# Patient Record
Sex: Male | Born: 1975 | Race: White | Hispanic: No | Marital: Married | Smoking: Never smoker
Health system: Southern US, Community
[De-identification: ages and names within clinical notes are randomized; demographics above are authoritative.]

## PROBLEM LIST (undated history)

## (undated) DIAGNOSIS — J45909 Unspecified asthma, uncomplicated: Secondary | ICD-10-CM

## (undated) HISTORY — PX: HERNIA REPAIR: SHX51

## (undated) HISTORY — DX: Unspecified asthma, uncomplicated: J45.909

---

## 2019-10-20 ENCOUNTER — Ambulatory Visit: Payer: Self-pay | Attending: Internal Medicine

## 2019-10-20 DIAGNOSIS — Z23 Encounter for immunization: Secondary | ICD-10-CM

## 2019-10-20 NOTE — Progress Notes (Signed)
   Covid-19 Vaccination Clinic  Name:  John Adams    MRN: [id number] DOB: 1975/08/04  10/20/2019  Mr. John Adams was observed post Covid-19 immunization for 15 minutes without incident. He was provided with Vaccine Information Sheet and instruction to access the V-Safe system.   Mr. John Adams was instructed to call 911 with any severe reactions post vaccine: John Adams Kitchen Difficulty breathing  . Swelling of face and throat  . A fast heartbeat  . A bad rash all over body  . Dizziness and weakness   Immunizations Administered    Name Date Dose VIS Date Route   Moderna COVID-19 Vaccine 10/20/2019  9:17 AM 0.5 mL 06/14/2019 Intramuscular   Manufacturer: Moderna   Lot: [id number]   NDC: 16553-748-27

## 2020-07-19 ENCOUNTER — Ambulatory Visit (AMBULATORY_SURGERY_CENTER): Payer: Self-pay

## 2020-07-19 ENCOUNTER — Emergency Department (HOSPITAL_BASED_OUTPATIENT_CLINIC_OR_DEPARTMENT_OTHER)
Admission: EM | Admit: 2020-07-19 | Discharge: 2020-07-19 | Disposition: A | Discharge status: Home / Self Care | Payer: BC Managed Care – PPO | Source: Home / Self Care | Attending: Emergency Medicine | Admitting: Emergency Medicine

## 2020-07-19 ENCOUNTER — Other Ambulatory Visit: Payer: Self-pay

## 2020-07-19 ENCOUNTER — Emergency Department (HOSPITAL_BASED_OUTPATIENT_CLINIC_OR_DEPARTMENT_OTHER): Payer: BC Managed Care – PPO

## 2020-07-19 ENCOUNTER — Encounter (HOSPITAL_BASED_OUTPATIENT_CLINIC_OR_DEPARTMENT_OTHER): Payer: Self-pay | Admitting: *Deleted

## 2020-07-19 VITALS — Ht 63.0 in | Wt 209.0 lb

## 2020-07-19 DIAGNOSIS — Z1211 Encounter for screening for malignant neoplasm of colon: Secondary | ICD-10-CM

## 2020-07-19 DIAGNOSIS — N10 Acute tubulo-interstitial nephritis: Secondary | ICD-10-CM | POA: Insufficient documentation

## 2020-07-19 DIAGNOSIS — Z6836 Body mass index (BMI) 36.0-36.9, adult: Secondary | ICD-10-CM | POA: Diagnosis not present

## 2020-07-19 DIAGNOSIS — T39395A Adverse effect of other nonsteroidal anti-inflammatory drugs [NSAID], initial encounter: Secondary | ICD-10-CM | POA: Diagnosis present

## 2020-07-19 DIAGNOSIS — N179 Acute kidney failure, unspecified: Secondary | ICD-10-CM | POA: Diagnosis not present

## 2020-07-19 DIAGNOSIS — Z7951 Long term (current) use of inhaled steroids: Secondary | ICD-10-CM | POA: Insufficient documentation

## 2020-07-19 DIAGNOSIS — N12 Tubulo-interstitial nephritis, not specified as acute or chronic: Secondary | ICD-10-CM

## 2020-07-19 DIAGNOSIS — R630 Anorexia: Secondary | ICD-10-CM | POA: Diagnosis present

## 2020-07-19 DIAGNOSIS — R109 Unspecified abdominal pain: Secondary | ICD-10-CM | POA: Insufficient documentation

## 2020-07-19 DIAGNOSIS — K760 Fatty (change of) liver, not elsewhere classified: Secondary | ICD-10-CM | POA: Diagnosis present

## 2020-07-19 DIAGNOSIS — K30 Functional dyspepsia: Secondary | ICD-10-CM | POA: Diagnosis present

## 2020-07-19 DIAGNOSIS — K5900 Constipation, unspecified: Secondary | ICD-10-CM | POA: Diagnosis present

## 2020-07-19 DIAGNOSIS — J45909 Unspecified asthma, uncomplicated: Secondary | ICD-10-CM | POA: Diagnosis present

## 2020-07-19 DIAGNOSIS — Z20822 Contact with and (suspected) exposure to covid-19: Secondary | ICD-10-CM | POA: Diagnosis present

## 2020-07-19 DIAGNOSIS — K219 Gastro-esophageal reflux disease without esophagitis: Secondary | ICD-10-CM | POA: Diagnosis present

## 2020-07-19 DIAGNOSIS — Z79899 Other long term (current) drug therapy: Secondary | ICD-10-CM | POA: Diagnosis not present

## 2020-07-19 DIAGNOSIS — E669 Obesity, unspecified: Secondary | ICD-10-CM | POA: Diagnosis present

## 2020-07-19 LAB — URINALYSIS, ROUTINE W REFLEX MICROSCOPIC
Bilirubin Urine: NEGATIVE
Glucose, UA: NEGATIVE mg/dL
Ketones, ur: NEGATIVE mg/dL
Nitrite: NEGATIVE
Protein, ur: >300 mg/dL — AB
Specific Gravity, Urine: 1.025 (ref 1.005–1.030)
pH: 7.5 (ref 5.0–8.0)

## 2020-07-19 LAB — COMPREHENSIVE METABOLIC PANEL
ALT: 43 U/L (ref 0–44)
AST: 24 U/L (ref 15–41)
Albumin: 4.1 g/dL (ref 3.5–5.0)
Alkaline Phosphatase: 81 U/L (ref 38–126)
Anion gap: 11 (ref 5–15)
BUN: 28 mg/dL — ABNORMAL HIGH (ref 6–20)
CO2: 26 mmol/L (ref 22–32)
Calcium: 10.1 mg/dL (ref 8.9–10.3)
Chloride: 101 mmol/L (ref 98–111)
Creatinine, Ser: 2.09 mg/dL — ABNORMAL HIGH (ref 0.61–1.24)
GFR, Estimated: 39 mL/min — ABNORMAL LOW (ref >60)
Glucose, Bld: 117 mg/dL — ABNORMAL HIGH (ref 70–99)
Potassium: 4.5 mmol/L (ref 3.5–5.1)
Sodium: 138 mmol/L (ref 135–145)
Total Bilirubin: 0.5 mg/dL (ref 0.3–1.2)
Total Protein: 7.9 g/dL (ref 6.5–8.1)

## 2020-07-19 LAB — URINALYSIS, MICROSCOPIC (REFLEX)

## 2020-07-19 LAB — CBC WITH DIFFERENTIAL/PLATELET
Abs Immature Granulocytes: 0.05 10*3/uL (ref 0.00–0.07)
Basophils Absolute: 0.1 10*3/uL (ref 0.0–0.1)
Basophils Relative: 1 %
Eosinophils Absolute: 0.5 10*3/uL (ref 0.0–0.5)
Eosinophils Relative: 3 %
HCT: 44.4 % (ref 39.0–52.0)
Hemoglobin: 14.7 g/dL (ref 13.0–17.0)
Immature Granulocytes: 0 %
Lymphocytes Relative: 14 %
Lymphs Abs: 2.3 10*3/uL (ref 0.7–4.0)
MCH: 29.8 pg (ref 26.0–34.0)
MCHC: 33.1 g/dL (ref 30.0–36.0)
MCV: 89.9 fL (ref 80.0–100.0)
Monocytes Absolute: 1.7 10*3/uL — ABNORMAL HIGH (ref 0.1–1.0)
Monocytes Relative: 10 %
Neutro Abs: 11.8 10*3/uL — ABNORMAL HIGH (ref 1.7–7.7)
Neutrophils Relative %: 72 %
Platelets: 356 10*3/uL (ref 150–400)
RBC: 4.94 MIL/uL (ref 4.22–5.81)
RDW: 12.3 % (ref 11.5–15.5)
WBC: 16.4 10*3/uL — ABNORMAL HIGH (ref 4.0–10.5)
nRBC: 0 % (ref 0.0–0.2)

## 2020-07-19 MED ORDER — NA SULFATE-K SULFATE-MG SULF 17.5-3.13-1.6 GM/177ML PO SOLN: 1.0000 | Freq: Once | ORAL | 0 refills | Status: AC

## 2020-07-19 MED ORDER — IBUPROFEN 800 MG PO TABS: 800.0000 mg | ORAL_TABLET | Freq: Three times a day (TID) | ORAL | 0 refills | Status: DC

## 2020-07-19 MED ORDER — MORPHINE SULFATE (PF) 4 MG/ML IV SOLN
4.0000 mg | Freq: Once | INTRAVENOUS | Status: AC
  Administered 2020-07-19: 4 mg via INTRAVENOUS
  Filled 2020-07-19: qty 1

## 2020-07-19 MED ORDER — KETOROLAC TROMETHAMINE 30 MG/ML IJ SOLN
30.0000 mg | Freq: Once | INTRAMUSCULAR | Status: AC
  Administered 2020-07-19: 30 mg via INTRAVENOUS
  Filled 2020-07-19: qty 1

## 2020-07-19 MED ORDER — CEPHALEXIN 500 MG PO CAPS: 500.0000 mg | ORAL_CAPSULE | Freq: Two times a day (BID) | ORAL | 0 refills | Status: DC

## 2020-07-19 MED ORDER — SODIUM CHLORIDE 0.9 % IV SOLN
1.0000 g | Freq: Once | INTRAVENOUS | Status: AC
  Administered 2020-07-19: 1 g via INTRAVENOUS
  Filled 2020-07-19: qty 10

## 2020-07-19 MED ORDER — SODIUM CHLORIDE 0.9 % IV BOLUS
2000.0000 mL | Freq: Once | INTRAVENOUS | Status: AC
  Administered 2020-07-19: 2000 mL via INTRAVENOUS

## 2020-07-19 NOTE — ED Notes (Signed)
Patient verbalizes understanding of discharge instructions. Opportunity for questioning and answers were provided. Armband removed by staff, pt discharged from ED ambulatory to home.  

## 2020-07-19 NOTE — ED Notes (Signed)
ED Provider at bedside. 

## 2020-07-19 NOTE — ED Provider Notes (Signed)
[facility] Provider Note   CSN: [id number] Arrival date & time: 07/19/20  1637     History Chief Complaint  Patient presents with  . Back Pain  . Chills    John Adams is a 45 y.o. male.  HPI 45 year old male with no significant medical history presents to the ER with bilateral back aches and chills for the last 3 days.  States that he has been taking Motrin with some relief, however pain returns.  He states the pain has gotten significantly worse today.  He states the pain will radiate into the front of his abdomen and it feels like "someone is sucking the air out of his abdomen".  Denies any dysuria but has felt like his urine flow has decreased.  This is never happened before.  He has vaccinated and boosted for COVID-19.  No history of IV drug use.    Past Medical History:  Diagnosis Date  . Asthma     There are no problems to display for this patient.   Past Surgical History:  Procedure Laterality Date  . HERNIA REPAIR         Family History  Problem Relation Age of Onset  . Colon cancer Neg Hx   . Colon polyps Neg Hx   . Esophageal cancer Neg Hx   . Rectal cancer Neg Hx   . Stomach cancer Neg Hx     Social History   Tobacco Use  . Smoking status: Never Smoker  . Smokeless tobacco: Never Used  Vaping Use  . Vaping Use: Never used  Substance Use Topics  . Alcohol use: Yes    Comment: occ  . Drug use: Never    Home Medications Prior to Admission medications   Medication Sig Start Date End Date Taking? Authorizing Provider  cephALEXin (KEFLEX) 500 MG capsule Take 1 capsule (500 mg total) by mouth 2 (two) times daily for 7 days. 07/19/20 07/26/20 Yes Garald Balding, PA-C  ibuprofen (ADVIL) 800 MG tablet Take 1 tablet (800 mg total) by mouth 3 (three) times daily. 07/19/20  Yes Garald Balding, PA-C  albuterol (PROAIR HFA) 108 (90 Base) MCG/ACT inhaler Inhale 2 puffs into the lungs every 6 (six) hours as needed. 03/04/19    [provider]  amitriptyline (ELAVIL) 25 MG tablet Take by mouth. 03/04/19   [provider]  fluticasone (FLONASE) 50 MCG/ACT nasal spray Place into the nose. 03/04/19   [provider]  fluticasone furoate-vilanterol (BREO ELLIPTA) 200-25 MCG/INH AEPB Inhale into the lungs. 07/06/19   [provider]  Fluticasone-Salmeterol (ADVAIR) 100-50 MCG/DOSE AEPB Inhale into the lungs. 03/04/19   [provider]  Na Sulfate-K Sulfate-Mg Sulf 17.5-3.13-1.6 GM/177ML SOLN Take 1 kit by mouth once for 1 dose. 07/19/20 07/19/20  Thornton Park, MD    Allergies    Patient has no known allergies.  Review of Systems   Review of Systems  Constitutional: Negative for chills and fever.  HENT: Negative for ear pain and sore throat.   Eyes: Negative for pain and visual disturbance.  Respiratory: Negative for cough and shortness of breath.   Cardiovascular: Negative for chest pain and palpitations.  Gastrointestinal: Positive for abdominal pain. Negative for vomiting.  Genitourinary: Negative for dysuria and hematuria.  Musculoskeletal: Positive for back pain. Negative for arthralgias.  Skin: Negative for color change and rash.  Neurological: Negative for seizures and syncope.  All other systems reviewed and are negative.   Physical Exam Updated Vital  Signs BP 122/85 (BP Location: Right Arm)   Pulse 85   Temp 98.6 F (37 C) (Oral)   Resp 20   Ht _0  (1.6 m)   Wt 95.1 kg   SpO2 98%   BMI 37.13 kg/m   Physical Exam Constitutional:      General: He is not in acute distress.    Appearance: Normal appearance. He is not ill-appearing or diaphoretic.  HENT:     Head: Normocephalic and atraumatic.  Eyes:     Extraocular Movements: Extraocular movements intact.     Conjunctiva/sclera: Conjunctivae normal.     Pupils: Pupils are equal, round, and reactive to light.  Cardiovascular:     Rate and Rhythm: Normal rate and regular rhythm.     Pulses: Normal  pulses.     Heart sounds: Normal heart sounds.  Pulmonary:     Effort: Pulmonary effort is normal.     Breath sounds: Normal breath sounds.  Abdominal:     General: Abdomen is flat. Bowel sounds are normal. There is no distension.     Palpations: Abdomen is soft. There is no mass.     Tenderness: There is no abdominal tenderness. There is right CVA tenderness and left CVA tenderness. There is no guarding or rebound.     Comments: No significant abdominal tenderness on exam.  Lateral flank tenderness on exam.  Musculoskeletal:        General: Normal range of motion.     Cervical back: Normal range of motion. No tenderness.  Skin:    General: Skin is warm.  Neurological:     General: No focal deficit present.     Mental Status: He is alert and oriented to person, place, and time.     Sensory: No sensory deficit.     Motor: No weakness.  Psychiatric:        Mood and Affect: Mood normal.        Behavior: Behavior normal.     ED Results / Procedures / Treatments   Labs (all labs ordered are listed, but only abnormal results are displayed) Labs Reviewed  URINALYSIS, ROUTINE W REFLEX MICROSCOPIC - Abnormal; Notable for the following components:      Result Value   Hgb urine dipstick LARGE (*)    Protein, ur >300 (*)    Leukocytes,Ua SMALL (*)    All other components within normal limits  CBC WITH DIFFERENTIAL/PLATELET - Abnormal; Notable for the following components:   WBC 16.4 (*)    Neutro Abs 11.8 (*)    Monocytes Absolute 1.7 (*)    All other components within normal limits  COMPREHENSIVE METABOLIC PANEL - Abnormal; Notable for the following components:   Glucose, Bld 117 (*)    BUN 28 (*)    Creatinine, Ser 2.09 (*)    GFR, Estimated 39 (*)    All other components within normal limits  URINALYSIS, MICROSCOPIC (REFLEX) - Abnormal; Notable for the following components:   Bacteria, UA FEW (*)    All other components within normal limits  SARS CORONAVIRUS 2 (TAT 6-24 HRS)   URINE CULTURE    EKG None  Radiology CT Renal Stone Study  Result Date: 07/19/2020 CLINICAL DATA:  45 year old male with history of bilateral flank pain. Microscopic hematuria. EXAM: CT ABDOMEN AND PELVIS WITHOUT CONTRAST TECHNIQUE: Multidetector CT imaging of the abdomen and pelvis was performed following the standard protocol without IV contrast. COMPARISON:  No priors. FINDINGS: Lower chest: Unremarkable. Hepatobiliary: Diffuse low attenuation  throughout the hepatic parenchyma of severe hepatic steatosis, indicative. No definite suspicious cystic or solid hepatic lesions are confidently identified on today's noncontrast CT examination. Unenhanced appearance of the gallbladder is normal. Pancreas: No definite pancreatic mass or peripancreatic fluid collections or inflammatory changes are noted on today's noncontrast CT examination. Spleen: Unremarkable. Adrenals/Urinary Tract: There are no abnormal calcifications within the collecting system of either kidney, along the course of either ureter, or within the lumen of the urinary bladder. No hydroureteronephrosis or perinephric stranding to suggest urinary tract obstruction at this time. The unenhanced appearance of the kidneys is unremarkable bilaterally. Unenhanced appearance of the urinary bladder is normal. Bilateral adrenal glands are normal in appearance. Stomach/Bowel: Unenhanced appearance of the stomach is normal. There is no pathologic dilatation of small bowel or colon. Normal appendix. Vascular/Lymphatic: Aortic atherosclerosis. No lymphadenopathy noted in the abdomen or pelvis. Reproductive: Prostate gland and seminal vesicles are unremarkable in appearance. Other: Status post bilateral inguinal herniorrhaphy. No significant volume of ascites. No pneumoperitoneum. Musculoskeletal: There are no aggressive appearing lytic or blastic lesions noted in the visualized portions of the skeleton. IMPRESSION: 1. No acute findings are noted in the  abdomen or pelvis to account for the patient's symptoms. Specifically, no urinary tract calculi no findings of urinary tract obstruction are noted at this time. 2. Hepatic steatosis. 3. Aortic atherosclerosis. Electronically Signed   By: Vinnie Langton M.D.   On: 07/19/2020 19:23    Procedures Procedures (including critical care time)  Medications Ordered in ED Medications  sodium chloride 0.9 % bolus 2,000 mL (0 mLs Intravenous Stopped 07/19/20 2104)  ketorolac (TORADOL) 30 MG/ML injection 30 mg (30 mg Intravenous Given 07/19/20 1920)  cefTRIAXone (ROCEPHIN) 1 g in sodium chloride 0.9 % 100 mL IVPB (0 g Intravenous Stopped 07/19/20 2104)  morphine 4 MG/ML injection 4 mg (4 mg Intravenous Given 07/19/20 2103)    ED Course  I have reviewed the triage vital signs and the nursing notes.  Pertinent labs & imaging results that were available during my care of the patient were reviewed by me and considered in my medical decision making (see chart for details).    MDM Rules/Calculators/A&P                          45 year old male with complaints of bilateral back pain and chills.  On arrival, patient is afebrile, of vitals on arrival overall reassuring.  Afebrile.  Physical exam with bilateral flank tenderness, no significant abdominal tenderness on exam.  CBC with a leukocytosis of 16.4, no other significant abnormalities.  CMP with a creatinine of 2.09.  UA with large amount of hemoglobin, more than 300 proteinuria and small leukocytes with few bacteria.  CT renal study without evidence of UTI.  Question myalgias versus pyelonephritis.  Low suspicion for dissection, cauda equina, spinal abscess.  Patient was treated here with Rocephin, will send home with course of Keflex.  Encourage PCP follow-up for recheck of creatinine.  Pain treated with Toradol and morphine.  Will send home with 800 mg of ibuprofen for pain.  Return precautions discussed.  He voiced understanding and is agreeable.  At this stage  in the ED course, the patient is medically screened and stable for discharge. Final Clinical Impression(s) / ED Diagnoses Final diagnoses:  Pyelonephritis    Rx / DC Orders ED Discharge Orders         Ordered    cephALEXin (KEFLEX) 500 MG capsule  2 times  daily        07/19/20 2134    ibuprofen (ADVIL) 800 MG tablet  3 times daily        07/19/20 2134           Lyndel Safe 07/19/20 2135    Charlesetta Shanks, MD 07/20/20 2120

## 2020-07-19 NOTE — ED Triage Notes (Signed)
Back pain and chills x 3 days. Pain is relieved with Motrin. Scanty urine output.

## 2020-07-19 NOTE — ED Notes (Signed)
Patient transported to CT 

## 2020-07-19 NOTE — Progress Notes (Signed)
No allergies to soy or egg Pt is not on blood thinners or diet pills Denies issues with sedation/intubation Denies atrial flutter/fib Denies constipation   Emmi instructions given to pt  Pt is aware of Covid safety and care partner requirements.  

## 2020-07-19 NOTE — Discharge Instructions (Addendum)
Your work-up today did not show any evidence of kidney stones, your urine culture still pending but we will treat you for a possible UTI that are spread to your kidneys.  Please take the antibiotic as directed until finished.  Use ibuprofen as needed for pain.  Your kidney function test was a little elevated today, please follow-up with your primary care doctor to have this rechecked.  Return to the ER for any new or worsening symptoms.

## 2020-07-20 LAB — SARS CORONAVIRUS 2 (TAT 6-24 HRS): SARS Coronavirus 2: NEGATIVE

## 2020-07-21 LAB — URINE CULTURE: Culture: NO GROWTH

## 2020-07-22 ENCOUNTER — Emergency Department (HOSPITAL_BASED_OUTPATIENT_CLINIC_OR_DEPARTMENT_OTHER): Payer: BC Managed Care – PPO

## 2020-07-22 ENCOUNTER — Other Ambulatory Visit: Payer: Self-pay

## 2020-07-22 ENCOUNTER — Inpatient Hospital Stay
Admission: AD | Admit: 2020-07-22 | Payer: BC Managed Care – PPO | Source: Other Acute Inpatient Hospital | Admitting: Internal Medicine

## 2020-07-22 ENCOUNTER — Inpatient Hospital Stay (HOSPITAL_BASED_OUTPATIENT_CLINIC_OR_DEPARTMENT_OTHER)
Admission: EM | Admit: 2020-07-22 | Discharge: 2020-07-24 | DRG: 684 | Disposition: A | Discharge status: Home / Self Care | Payer: BC Managed Care – PPO | Attending: Internal Medicine | Admitting: Internal Medicine

## 2020-07-22 ENCOUNTER — Encounter (HOSPITAL_BASED_OUTPATIENT_CLINIC_OR_DEPARTMENT_OTHER): Payer: Self-pay

## 2020-07-22 DIAGNOSIS — Z20822 Contact with and (suspected) exposure to covid-19: Secondary | ICD-10-CM | POA: Diagnosis present

## 2020-07-22 DIAGNOSIS — R109 Unspecified abdominal pain: Secondary | ICD-10-CM

## 2020-07-22 DIAGNOSIS — K760 Fatty (change of) liver, not elsewhere classified: Secondary | ICD-10-CM | POA: Diagnosis present

## 2020-07-22 DIAGNOSIS — N179 Acute kidney failure, unspecified: Secondary | ICD-10-CM | POA: Diagnosis present

## 2020-07-22 DIAGNOSIS — J45909 Unspecified asthma, uncomplicated: Secondary | ICD-10-CM | POA: Diagnosis present

## 2020-07-22 DIAGNOSIS — K219 Gastro-esophageal reflux disease without esophagitis: Secondary | ICD-10-CM | POA: Diagnosis present

## 2020-07-22 DIAGNOSIS — K30 Functional dyspepsia: Secondary | ICD-10-CM | POA: Diagnosis present

## 2020-07-22 DIAGNOSIS — N10 Acute tubulo-interstitial nephritis: Secondary | ICD-10-CM | POA: Diagnosis present

## 2020-07-22 DIAGNOSIS — Z6836 Body mass index (BMI) 36.0-36.9, adult: Secondary | ICD-10-CM | POA: Diagnosis not present

## 2020-07-22 DIAGNOSIS — E669 Obesity, unspecified: Secondary | ICD-10-CM | POA: Diagnosis present

## 2020-07-22 DIAGNOSIS — K5900 Constipation, unspecified: Secondary | ICD-10-CM | POA: Diagnosis present

## 2020-07-22 DIAGNOSIS — R1010 Upper abdominal pain, unspecified: Secondary | ICD-10-CM

## 2020-07-22 DIAGNOSIS — R630 Anorexia: Secondary | ICD-10-CM | POA: Diagnosis present

## 2020-07-22 DIAGNOSIS — T39395A Adverse effect of other nonsteroidal anti-inflammatory drugs [NSAID], initial encounter: Secondary | ICD-10-CM | POA: Diagnosis present

## 2020-07-22 DIAGNOSIS — Z79899 Other long term (current) drug therapy: Secondary | ICD-10-CM

## 2020-07-22 LAB — COMPREHENSIVE METABOLIC PANEL
ALT: 24 U/L (ref 0–44)
AST: 18 U/L (ref 15–41)
Albumin: 3.4 g/dL — ABNORMAL LOW (ref 3.5–5.0)
Alkaline Phosphatase: 72 U/L (ref 38–126)
Anion gap: 11 (ref 5–15)
BUN: 46 mg/dL — ABNORMAL HIGH (ref 6–20)
CO2: 22 mmol/L (ref 22–32)
Calcium: 8.4 mg/dL — ABNORMAL LOW (ref 8.9–10.3)
Chloride: 104 mmol/L (ref 98–111)
Creatinine, Ser: 4.44 mg/dL — ABNORMAL HIGH (ref 0.61–1.24)
GFR, Estimated: 16 mL/min — ABNORMAL LOW (ref >60)
Glucose, Bld: 112 mg/dL — ABNORMAL HIGH (ref 70–99)
Potassium: 4 mmol/L (ref 3.5–5.1)
Sodium: 137 mmol/L (ref 135–145)
Total Bilirubin: 0.5 mg/dL (ref 0.3–1.2)
Total Protein: 7.2 g/dL (ref 6.5–8.1)

## 2020-07-22 LAB — CBC WITH DIFFERENTIAL/PLATELET
Abs Immature Granulocytes: 0.05 10*3/uL (ref 0.00–0.07)
Basophils Absolute: 0.1 10*3/uL (ref 0.0–0.1)
Basophils Relative: 1 %
Eosinophils Absolute: 0.4 10*3/uL (ref 0.0–0.5)
Eosinophils Relative: 3 %
HCT: 41.8 % (ref 39.0–52.0)
Hemoglobin: 14.2 g/dL (ref 13.0–17.0)
Immature Granulocytes: 0 %
Lymphocytes Relative: 12 %
Lymphs Abs: 1.7 10*3/uL (ref 0.7–4.0)
MCH: 30.1 pg (ref 26.0–34.0)
MCHC: 34 g/dL (ref 30.0–36.0)
MCV: 88.6 fL (ref 80.0–100.0)
Monocytes Absolute: 2.1 10*3/uL — ABNORMAL HIGH (ref 0.1–1.0)
Monocytes Relative: 16 %
Neutro Abs: 9.3 10*3/uL — ABNORMAL HIGH (ref 1.7–7.7)
Neutrophils Relative %: 68 %
Platelets: 334 10*3/uL (ref 150–400)
RBC: 4.72 MIL/uL (ref 4.22–5.81)
RDW: 12.4 % (ref 11.5–15.5)
WBC: 13.7 10*3/uL — ABNORMAL HIGH (ref 4.0–10.5)
nRBC: 0 % (ref 0.0–0.2)

## 2020-07-22 LAB — PROTIME-INR
INR: 1 (ref 0.8–1.2)
Prothrombin Time: 13.2 seconds (ref 11.4–15.2)

## 2020-07-22 LAB — BASIC METABOLIC PANEL
Anion gap: 14 (ref 5–15)
BUN: 41 mg/dL — ABNORMAL HIGH (ref 6–20)
CO2: 18 mmol/L — ABNORMAL LOW (ref 22–32)
Calcium: 8.4 mg/dL — ABNORMAL LOW (ref 8.9–10.3)
Chloride: 103 mmol/L (ref 98–111)
Creatinine, Ser: 4.04 mg/dL — ABNORMAL HIGH (ref 0.61–1.24)
GFR, Estimated: 18 mL/min — ABNORMAL LOW (ref >60)
Glucose, Bld: 95 mg/dL (ref 70–99)
Potassium: 3.8 mmol/L (ref 3.5–5.1)
Sodium: 135 mmol/L (ref 135–145)

## 2020-07-22 LAB — URINALYSIS, ROUTINE W REFLEX MICROSCOPIC
Bilirubin Urine: NEGATIVE
Glucose, UA: NEGATIVE mg/dL
Ketones, ur: NEGATIVE mg/dL
Leukocytes,Ua: NEGATIVE
Nitrite: NEGATIVE
Protein, ur: NEGATIVE mg/dL
Specific Gravity, Urine: 1.01 (ref 1.005–1.030)
pH: 6 (ref 5.0–8.0)

## 2020-07-22 LAB — TSH: TSH: 4.021 u[IU]/mL (ref 0.350–4.500)

## 2020-07-22 LAB — RESP PANEL BY RT-PCR (FLU A&B, COVID) ARPGX2
Influenza A by PCR: NEGATIVE
Influenza B by PCR: NEGATIVE
SARS Coronavirus 2 by RT PCR: NEGATIVE

## 2020-07-22 LAB — URINALYSIS, MICROSCOPIC (REFLEX)

## 2020-07-22 LAB — CK: Total CK: 88 U/L (ref 49–397)

## 2020-07-22 LAB — NA AND K (SODIUM & POTASSIUM), RAND UR
Potassium Urine: 12 mmol/L
Sodium, Ur: 54 mmol/L

## 2020-07-22 LAB — MRSA PCR SCREENING

## 2020-07-22 LAB — C-REACTIVE PROTEIN: CRP: 8 mg/dL — ABNORMAL HIGH (ref <1.0)

## 2020-07-22 LAB — LIPASE, BLOOD: Lipase: 24 U/L (ref 11–51)

## 2020-07-22 LAB — CREATININE, URINE, RANDOM: Creatinine, Urine: 58.31 mg/dL

## 2020-07-22 LAB — HIV ANTIBODY (ROUTINE TESTING W REFLEX): HIV Screen 4th Generation wRfx: NONREACTIVE

## 2020-07-22 MED ORDER — HEPARIN SODIUM (PORCINE) 5000 UNIT/ML IJ SOLN
5000.0000 [IU] | Freq: Three times a day (TID) | INTRAMUSCULAR | Status: DC
  Administered 2020-07-22 – 2020-07-24 (×5): 5000 [IU] via SUBCUTANEOUS
  Filled 2020-07-22 (×5): qty 1

## 2020-07-22 MED ORDER — FENTANYL CITRATE (PF) 100 MCG/2ML IJ SOLN
100.0000 ug | Freq: Once | INTRAMUSCULAR | Status: DC
Start: 2020-07-22 — End: 2020-07-24
  Filled 2020-07-22: qty 2

## 2020-07-22 MED ORDER — ALBUTEROL SULFATE HFA 108 (90 BASE) MCG/ACT IN AERS
2.0000 | INHALATION_SPRAY | Freq: Four times a day (QID) | RESPIRATORY_TRACT | Status: DC | PRN
  Filled 2020-07-22: qty 6.7

## 2020-07-22 MED ORDER — ACETAMINOPHEN 325 MG PO TABS
650.0000 mg | ORAL_TABLET | Freq: Four times a day (QID) | ORAL | Status: DC | PRN
  Administered 2020-07-24: 650 mg via ORAL
  Filled 2020-07-22: qty 2

## 2020-07-22 MED ORDER — SODIUM CHLORIDE 0.9 % IV SOLN
INTRAVENOUS | Status: DC | PRN
  Administered 2020-07-22: 250 mL via INTRAVENOUS

## 2020-07-22 MED ORDER — SODIUM CHLORIDE 0.9 % IV SOLN
1.0000 g | Freq: Once | INTRAVENOUS | Status: AC
  Administered 2020-07-22: 1 g via INTRAVENOUS
  Filled 2020-07-22: qty 10

## 2020-07-22 MED ORDER — SODIUM CHLORIDE 0.9 % IV BOLUS
1000.0000 mL | Freq: Once | INTRAVENOUS | Status: AC
  Administered 2020-07-22: 1000 mL via INTRAVENOUS

## 2020-07-22 MED ORDER — ACETAMINOPHEN 650 MG RE SUPP: 650.0000 mg | Freq: Four times a day (QID) | RECTAL | Status: DC | PRN

## 2020-07-22 NOTE — ED Notes (Signed)
Pt refused pain medicaton, reports having small BM and pain decreased to 5/10. EDP Schlossman notified.

## 2020-07-22 NOTE — ED Notes (Signed)
Spoke with Okey Regal in lab to add on lipase

## 2020-07-22 NOTE — ED Triage Notes (Signed)
Pt was recently seen here for back pain sent home on antibiotic for UTI pt reports he is getting worse states that he no longer hurts in his back but is having pain in his abdomen now. Reports feeling nauseous with last BM on Thursday.

## 2020-07-22 NOTE — ED Notes (Signed)
Pt ambulatory with steady gait to restroom 

## 2020-07-22 NOTE — ED Notes (Signed)
Waiting for EDP to update patient regarding foley

## 2020-07-22 NOTE — H&P (Signed)
History and Physical    John Adams EPP:[id number] DOB: 1976/03/01 DOA: 07/22/2020  PCP: Maud Deed, PA   Patient coming from: home Chief Complaint: continued back pain and worsening AKI at ED  HPI: John Adams is a 45 y.o. male with a pertinent history obesity, ?Asthma and has been undergoing a chronic cough work-up in outpatient setting, especially in [place], who presented to the med [facility] ED with persistence of pain in his back.  On January 6 the patient presented to the ED with chills, backaches and was thought to have pyelonephritis and was started on Rocephin and then received a course of Keflex.  Creatinine was up to 2.09 and UA was with proteinuria, CT renal stone was done without evidence of UTI.  Keflex did not help and then the patient return to the ED with similar symptoms.  Of note, the patient states that he has been fairly regular with taking 800 mg of ibuprofen every 8 hours for quite some time with the above process. He has been constipated for 4-5 days and in the room has a bowel movement and feels this resolves.  A Foley was placed with 200 mL of fluid removed  In the med [facility] emergency department, patient was initially hypertensive, 98.9, HR 87, 98% on room air, RR 18.  WBC 13.7, Hb 14.2, PLT 334, NA 137, K4.0, CO2 22, creatinine of 4.44, calcium of 8.4, glucose 112, lactic acid and blood cultures pending.  UA shows 6-10 RBCs and was 21-50 RBCs 3 days ago.  COVID was negative.  Previous ED visit urine culture was negative CT scan of the abdomen pelvis was done showing diffuse fatty change of the liver and previous mesh hernia repair in the lower abdominal and inguinal regions and otherwise negative, kidneys are normal, bladder is normal.  They contacted nephrology in the emergency department  For the past 2 to 3 years he has had a chronic cough that has been working up especially in [place].  He states he saw an ENT provider and I asked him to  try and track these records down to see if there was any autoimmune work-up.  He complains of chronic cough, sinus congestion and does not necessarily relate them to allergies but is reasonable to say that is possible given his history of asthma as well.  I asked him to try and track down his ENT records to see if there was any lab work done. They moved here from [place] about a year ago and have been living in [place] and buying up apartment buildings and turning them into apartments, they have 2 dogs and 1 cat, no unusual hobbies, no tick bites, no bird exposures.   Review of Systems: As per HPI otherwise 10 point review of systems negative.  Other pertinents as below:  General -is any new headaches, visual changes, denies any fevers or chills HEENT -denies any recent illness or upper respiratory infection Cardio -denies any chest pain or palpitations Resp -denies any shortness of breath, seldom uses albuterol GI -as per HPI GU -denies any urinary changes that he is noted MSK -denies any joint pains or synovitis Skin -denies any new rashes or skin changes Neuro -denies any new numbness or weakness Psych -denies any new anxiety or depression   Past Medical History:  Diagnosis Date  . Asthma     Past Surgical History:  Procedure Laterality Date  . HERNIA REPAIR       reports that he has  never smoked. He has never used smokeless tobacco. He reports current alcohol use. He reports that he does not use drugs.  No Known Allergies  Family History  Problem Relation Age of Onset  . Colon cancer Neg Hx   . Colon polyps Neg Hx   . Esophageal cancer Neg Hx   . Rectal cancer Neg Hx   . Stomach cancer Neg Hx     Prior to Admission medications   Medication Sig Start Date End Date Taking? Authorizing Provider  cephALEXin (KEFLEX) 500 MG capsule Take 1 capsule (500 mg total) by mouth 2 (two) times daily for 7 days. 07/19/20 07/26/20 Yes Mare FerrariBelaya, Maria A, PA-C  ibuprofen (ADVIL) 200 MG tablet  Take 800 mg by mouth 3 (three) times daily as needed (pain).   Yes [provider]  montelukast (SINGULAIR) 10 MG tablet Take 10 mg by mouth 3 (three) times a week.   Yes [provider]  albuterol (VENTOLIN HFA) 108 (90 Base) MCG/ACT inhaler Inhale 2 puffs into the lungs every 6 (six) hours as needed. Patient not taking: No sig reported 03/04/19   [provider]  ibuprofen (ADVIL) 800 MG tablet Take 1 tablet (800 mg total) by mouth 3 (three) times daily. Patient not taking: No sig reported 07/19/20   Mare FerrariBelaya, Maria A, PA-C    Physical Exam: Vitals:   07/22/20 1500 07/22/20 1650 07/22/20 1743 07/22/20 1930  BP: (!) 150/89 (!) 139/98 (!) 140/95 (!) 151/99  Pulse: (!) 58 73 74 73  Resp:  18 20 20   Temp:  98.3 F (36.8 C) 98.5 F (36.9 C) 99 F (37.2 C)  TempSrc:  Oral Oral Oral  SpO2: 98% 98% 99% 99%  Weight:      Height:        Constitutional: NAD, comfortable, obese Eyes: pupils equal and reactive to light, anicteric, without injection ENMT: MMM, throat without exudates or erythema, no saddlenose deformity noted Neck: normal, supple, no masses, no thyromegaly noted Respiratory: CTAB, nwob, no coarse rales noted Cardiovascular: rrr w/o mrg, warm extremities Abdomen: NBS, NT, soft GU: Foley placed Musculoskeletal: moving all 4 extremities, strength grossly intact 5/5 in the UE and LE's, ambulating appropriately Skin: no rashes, lesions, ulcers. No induration Neurologic: CN 2-12 grossly intact. Sensation intact Psychiatric: AO appearing, mentation appropriate  Labs on Admission: I have personally reviewed following labs and imaging studies  CBC: Recent Labs  Lab 07/19/20 1653 07/22/20 1012  WBC 16.4* 13.7*  NEUTROABS 11.8* 9.3*  HGB 14.7 14.2  HCT 44.4 41.8  MCV 89.9 88.6  PLT 356 334   Basic Metabolic Panel: Recent Labs  Lab 07/19/20 1653 07/22/20 1012 07/22/20 1950  NA 138 137 135  K 4.5 4.0 3.8  CL 101 104 103  CO2 26 22 18*   GLUCOSE 117* 112* 95  BUN 28* 46* 41*  CREATININE 2.09* 4.44* 4.04*  CALCIUM 10.1 8.4* 8.4*   GFR: Estimated Creatinine Clearance: 23.6 mL/min (A) (by C-G formula based on SCr of 4.04 mg/dL (H)). Liver Function Tests: Recent Labs  Lab 07/19/20 1653 07/22/20 1012  AST 24 18  ALT 43 24  ALKPHOS 81 72  BILITOT 0.5 0.5  PROT 7.9 7.2  ALBUMIN 4.1 3.4*   Recent Labs  Lab 07/22/20 1012  LIPASE 24   No results for input(s): AMMONIA in the last 168 hours. Coagulation Profile: Recent Labs  Lab 07/22/20 1950  INR 1.0   Cardiac Enzymes: No results for input(s): CKTOTAL, CKMB, CKMBINDEX, TROPONINI in the  last 168 hours. BNP (last 3 results) No results for input(s): PROBNP in the last 8760 hours. HbA1C: No results for input(s): HGBA1C in the last 72 hours. CBG: No results for input(s): GLUCAP in the last 168 hours. Lipid Profile: No results for input(s): CHOL, HDL, LDLCALC, TRIG, CHOLHDL, LDLDIRECT in the last 72 hours. Thyroid Function Tests: Recent Labs    07/22/20 1950  TSH 4.021   Anemia Panel: No results for input(s): VITAMINB12, FOLATE, FERRITIN, TIBC, IRON, RETICCTPCT in the last 72 hours. Urine analysis:    Component Value Date/Time   COLORURINE STRAW (A) 07/22/2020 1058   APPEARANCEUR CLEAR 07/22/2020 1058   LABSPEC 1.010 07/22/2020 1058   PHURINE 6.0 07/22/2020 1058   GLUCOSEU NEGATIVE 07/22/2020 1058   HGBUR LARGE (A) 07/22/2020 1058   BILIRUBINUR NEGATIVE 07/22/2020 1058   KETONESUR NEGATIVE 07/22/2020 1058   PROTEINUR NEGATIVE 07/22/2020 1058   NITRITE NEGATIVE 07/22/2020 1058   LEUKOCYTESUR NEGATIVE 07/22/2020 1058    Radiological Exams on Admission: CT ABDOMEN PELVIS WO CONTRAST  Addendum Date: 07/22/2020   ADDENDUM REPORT: 07/22/2020 13:04 ADDENDUM: Comparison study is a CT. Electronically Signed   By: Paulina Fusi M.D.   On: 07/22/2020 13:04   Result Date: 07/22/2020 CLINICAL DATA:  Epigastric abdominal pain beginning last week. Negative  abdominal ultrasound 3 days ago, other than fatty liver. EXAM: CT ABDOMEN AND PELVIS WITHOUT CONTRAST TECHNIQUE: Multidetector CT imaging of the abdomen and pelvis was performed following the standard protocol without IV contrast. COMPARISON:  Ultrasound 07/19/2020 FINDINGS: Lower chest: Normal Hepatobiliary: Diffuse fatty change of the liver. This can be associated with pain. No calcified gallstones. Pancreas: Normal Spleen: Normal Adrenals/Urinary Tract: Adrenal glands are normal. Kidneys are normal. No cyst, mass, stone or hydronephrosis. Bladder is normal. Stomach/Bowel: Stomach and small intestine are normal. Appendix is normal. No colon pathology visible. Vascular/Lymphatic: Normal Reproductive: Normal Other: No free fluid or air. Previous mesh hernia repair in the lower abdominal and inguinal regions. Musculoskeletal: Normal IMPRESSION: 1. Diffuse fatty change of the liver. This can be associated with pain. No calcified gallstones. 2. Previous mesh hernia repair in the lower abdominal and inguinal regions. 3. Otherwise negative scan. Electronically Signed: By: Paulina Fusi M.D. On: 07/22/2020 12:52    EKG: n/a  Assessment/Plan Active Problems:   Acute kidney injury (HCC)   Needs to be medrecc'ed  AKI  Assessment - with sinus congestion and asthma concern for autoimmune or maybe this is all from NSAID use and less likely from a GN, given his history of congestion and cough wonder about things like GN but ENT apparently scoped him and didn't see much reasurring against wegeners .  I asked the family to try and track down these records.  Creatinine has improved this evneing so hopefully it will continue in this trend and maybe this was all from NSAID use.  AIN is a possibility with wbc casts.  No mention of rbc casts.  Also patient started singulair as a possibility of etiology. Nephrology consult, appreciate their help.   --I asked to do daily weights, strict i/o's, place foley and document if  anything comes out but no obstructive process on CT scan --consider urine eosinophils and urine sodium at the beginning of this process I thought would help --CK with back pain (rhabdo?), UNa, UCr --any new meds started within past 6 months? --with back pain wonder if a Renal ultrasound would be more helpful then a CT scan but not sure. --watch out for post ATN diuresis --  if no improvement tomorrow probably ANCA, ANA, etc.  Appreciate nephrology's help.  UTI is doubtful and he hasn't noticed improvement of symptoms.  Not clear infection so will hold off on abx.    Constipation -  Abdominal pain/back pain - mostly resolved with bowel movement so think related to constipation. Will ctm.  Asthma, sounds intermittent - albuterol prn.    DVT prophylaxis: Heparin SQ Code Status: Full code Family Communication: wife at bedside Disposition Plan: home when able Consults called: ED had contacted Dr. Valentino Nose  and Dr. Vallery Sa by phone Admission status: inpatient because it will take take to come to this diagnosis and because of its severity feel he needs inpatient care to work this up.  A total of 72 minutes utilized during this admission.  Charlane Ferretti DO [facility]   If 7PM-7AM, please contact night-coverage [email or web address] Password [facility]  07/22/2020, 9:39 PM

## 2020-07-22 NOTE — ED Provider Notes (Signed)
[facility] EMERGENCY DEPARTMENT Provider Note   CSN: [id number] Arrival date & time: 07/22/20  0940     History Chief Complaint  Patient presents with  . Abdominal Pain    John Adams is a 45 y.o. male.  HPI      45yo male with history of asthma presents with concern for continuing abdominal pain and fever after evaluation for flank pain on 1/6.    Pain started a few days before Thursday, back pain Fever 101.3 yesterday, was 100.3 sometime yesterday   Last week, started as back pain and chills, progressed on Thursday more severe, radiating to the abdomen, feels like something "sucking front body in", no fevers until yesterday.  Fatigue, lightheadedness No nausea or vomiting Headache yesterday and today, light sensitivity  Cough unchanged for 3 years, no sore throat, no dyspnea, no chest pain   Left sided abdominal pain, LUQ, left mid abdomen, pain in back has resolved  No dysuria  Constipation, last BM was Thursday  4 tablets ibuprofen 200mg  took it 3 times yesterday Drank a lot of fluids, ate some meatballs, could taste it/reflux   Past Medical History:  Diagnosis Date  . Asthma     Patient Active Problem List   Diagnosis Date Noted  . Acute kidney injury (HCC) 07/22/2020    Past Surgical History:  Procedure Laterality Date  . HERNIA REPAIR         Family History  Problem Relation Age of Onset  . Colon cancer Neg Hx   . Colon polyps Neg Hx   . Esophageal cancer Neg Hx   . Rectal cancer Neg Hx   . Stomach cancer Neg Hx     Social History   Tobacco Use  . Smoking status: Never Adams  . Smokeless tobacco: Never Used  Vaping Use  . Vaping Use: Never used  Substance Use Topics  . Alcohol use: Yes    Comment: occ  . Drug use: Never    Home Medications Prior to Admission medications   Medication Sig Start Date End Date Taking? Authorizing Provider  albuterol (PROAIR HFA) 108 (90 Base) MCG/ACT inhaler Inhale 2 puffs into  the lungs every 6 (six) hours as needed. 03/04/19   [provider]  amitriptyline (ELAVIL) 25 MG tablet Take by mouth. 03/04/19   [provider]  cephALEXin (KEFLEX) 500 MG capsule Take 1 capsule (500 mg total) by mouth 2 (two) times daily for 7 days. 07/19/20 07/26/20  07/28/20, PA-C  fluticasone (FLONASE) 50 MCG/ACT nasal spray Place into the nose. 03/04/19   [provider]  fluticasone furoate-vilanterol (BREO ELLIPTA) 200-25 MCG/INH AEPB Inhale into the lungs. 07/06/19   [provider]  Fluticasone-Salmeterol (ADVAIR) 100-50 MCG/DOSE AEPB Inhale into the lungs. 03/04/19   [provider]  ibuprofen (ADVIL) 800 MG tablet Take 1 tablet (800 mg total) by mouth 3 (three) times daily. 07/19/20   09/16/20, PA-C    Allergies    Patient has no known allergies.  Review of Systems   Review of Systems  Constitutional: Positive for appetite change, chills and fever.  HENT: Negative for sore throat.   Eyes: Negative for visual disturbance.  Respiratory: Positive for cough (chronic for 3 years unchanged). Negative for shortness of breath.   Cardiovascular: Negative for chest pain.  Gastrointestinal: Positive for abdominal pain, constipation and nausea. Negative for diarrhea and vomiting.  Genitourinary: Negative for difficulty urinating and dysuria.  Musculoskeletal: Negative for back pain and neck  stiffness.  Skin: Negative for rash.  Neurological: Negative for syncope, light-headedness and headaches.    Physical Exam Updated Vital Signs BP (!) 140/95   Pulse 74   Temp 98.5 F (36.9 C) (Oral)   Resp 20   Ht 5\' 3"  (1.6 m)   Wt 95.2 kg   SpO2 99%   BMI 37.18 kg/m   Physical Exam Vitals and nursing note reviewed.  Constitutional:      General: He is not in acute distress.    Appearance: He is well-developed and well-nourished. He is not diaphoretic.  HENT:     Head: Normocephalic and atraumatic.  Eyes:     Extraocular Movements:  EOM normal.     Conjunctiva/sclera: Conjunctivae normal.  Cardiovascular:     Rate and Rhythm: Normal rate and regular rhythm.     Pulses: Intact distal pulses.     Heart sounds: Normal heart sounds. No murmur heard. No friction rub. No gallop.   Pulmonary:     Effort: Pulmonary effort is normal. No respiratory distress.     Breath sounds: Normal breath sounds. No wheezing or rales.  Abdominal:     General: There is no distension.     Palpations: Abdomen is soft.     Tenderness: There is abdominal tenderness in the left upper quadrant and left lower quadrant. There is no guarding.  Musculoskeletal:        General: No edema.     Cervical back: Normal range of motion.  Skin:    General: Skin is warm and dry.  Neurological:     Mental Status: He is alert and oriented to person, place, and time.     ED Results / Procedures / Treatments   Labs (all labs ordered are listed, but only abnormal results are displayed) Labs Reviewed  CBC WITH DIFFERENTIAL/PLATELET - Abnormal; Notable for the following components:      Result Value   WBC 13.7 (*)    Neutro Abs 9.3 (*)    Monocytes Absolute 2.1 (*)    All other components within normal limits  COMPREHENSIVE METABOLIC PANEL - Abnormal; Notable for the following components:   Glucose, Bld 112 (*)    BUN 46 (*)    Creatinine, Ser 4.44 (*)    Calcium 8.4 (*)    Albumin 3.4 (*)    GFR, Estimated 16 (*)    All other components within normal limits  URINALYSIS, ROUTINE W REFLEX MICROSCOPIC - Abnormal; Notable for the following components:   Color, Urine STRAW (*)    Hgb urine dipstick LARGE (*)    All other components within normal limits  URINALYSIS, MICROSCOPIC (REFLEX) - Abnormal; Notable for the following components:   Bacteria, UA MANY (*)    All other components within normal limits  RESP PANEL BY RT-PCR (FLU A&B, COVID) ARPGX2  URINE CULTURE  CULTURE, BLOOD (ROUTINE X 2)  CULTURE, BLOOD (ROUTINE X 2)  MRSA PCR SCREENING   LIPASE, BLOOD  NA AND K (SODIUM & POTASSIUM), RAND UR  CREATININE, URINE, RANDOM  CK  SEDIMENTATION RATE  C-REACTIVE PROTEIN  HIV ANTIBODY (ROUTINE TESTING W REFLEX)  PROTIME-INR  TSH  COMPREHENSIVE METABOLIC PANEL  CBC WITH DIFFERENTIAL/PLATELET  BASIC METABOLIC PANEL    EKG None  Radiology CT ABDOMEN PELVIS WO CONTRAST  Addendum Date: 07/22/2020   ADDENDUM REPORT: 07/22/2020 13:04 ADDENDUM: Comparison study is a CT. Electronically Signed   By: 09/19/2020 M.D.   On: 07/22/2020 13:04   Result Date: 07/22/2020  CLINICAL DATA:  Epigastric abdominal pain beginning last week. Negative abdominal ultrasound 3 days ago, other than fatty liver. EXAM: CT ABDOMEN AND PELVIS WITHOUT CONTRAST TECHNIQUE: Multidetector CT imaging of the abdomen and pelvis was performed following the standard protocol without IV contrast. COMPARISON:  Ultrasound 07/19/2020 FINDINGS: Lower chest: Normal Hepatobiliary: Diffuse fatty change of the liver. This can be associated with pain. No calcified gallstones. Pancreas: Normal Spleen: Normal Adrenals/Urinary Tract: Adrenal glands are normal. Kidneys are normal. No cyst, mass, stone or hydronephrosis. Bladder is normal. Stomach/Bowel: Stomach and small intestine are normal. Appendix is normal. No colon pathology visible. Vascular/Lymphatic: Normal Reproductive: Normal Other: No free fluid or air. Previous mesh hernia repair in the lower abdominal and inguinal regions. Musculoskeletal: Normal IMPRESSION: 1. Diffuse fatty change of the liver. This can be associated with pain. No calcified gallstones. 2. Previous mesh hernia repair in the lower abdominal and inguinal regions. 3. Otherwise negative scan. Electronically Signed: By: Paulina FusiMark  Shogry M.D. On: 07/22/2020 12:52    Procedures Procedures (including critical care time)  Medications Ordered in ED Medications  fentaNYL (SUBLIMAZE) injection 100 mcg (100 mcg Intravenous Not Given 07/22/20 1217)  0.9 %  sodium chloride  infusion (250 mLs Intravenous New Bag/Given 07/22/20 1349)  albuterol (VENTOLIN HFA) 108 (90 Base) MCG/ACT inhaler 2 puff (has no administration in time range)  heparin injection 5,000 Units (has no administration in time range)  acetaminophen (TYLENOL) tablet 650 mg (has no administration in time range)    Or  acetaminophen (TYLENOL) suppository 650 mg (has no administration in time range)  sodium chloride 0.9 % bolus 1,000 mL ( Intravenous Stopped 07/22/20 1210)  cefTRIAXone (ROCEPHIN) 1 g in sodium chloride 0.9 % 100 mL IVPB ( Intravenous Stopped 07/22/20 1427)    ED Course  I have reviewed the triage vital signs and the nursing notes.  Pertinent labs & imaging results that were available during my care of the patient were reviewed by me and considered in my medical decision making (see chart for details).    MDM Rules/Calculators/A&P                          45yo male with history of asthma presents with concern for continuing abdominal pain and fever after evaluation for flank pain on 1/6.   Had CT which showed no acute abnormalities, Cr of 2, and was sent home with treatment for possible UTI>   CT repeated given worsening abdominal pain with oral contrast shows no evidence of acute abnormalities.  Given days of symptoms without signs of abnormalities even on noncontrast scan,` normal pulses, have low suspicion for dissection/vascular phenomena and feel risks of contrast outweigh benefits in setting of low clinical suspicion and severe AKI. Abdominal pain possible gastritis but consider other rare etiologies such as polyarteritis nodosa/vasculitis in setting of corresponding renal failure.     Afebrile in ED but reports fever at home. No acute cough and doubt pneumonia. UA cx from days ago negative but after discussion with nephrology will initiate abx in setting of WBC casts on UA and report of fever. Denies IVDU. Blood cx ordered and pending.   Labs significant for increase in Cr to 4.4  from 2 3 days ago and previous Cr of .8 05/2020.  Had urine with protein/blood days ago, today shows WBC cast, hyaline casts.   Renal failure may be secondary to dehydration, NSAID use but consider other etiology of renal failure including glomerulonephritis/AIN. Given IV  fluid in the ED, ordered urine studies, foley placed per hospitalist request.    Discussed with Nephrology, Dr. Valentino Nose. Will admit for further evaluation and treatment of renal failure to [facility].    Final Clinical Impression(s) / ED Diagnoses Final diagnoses:  Acute kidney injury (HCC)  Abdominal pain, unspecified abdominal location    Rx / DC Orders ED Discharge Orders    None       Alvira Monday, MD 07/22/20 1826

## 2020-07-22 NOTE — Treatment Plan (Signed)
Brief Nephrology Note  Called by Dr. Dalene Seltzer in [facility] ER for pt w/ AKI, hematuria, and pyuria. At home has had back pain, chills, and fever. He has been taking ibuprofen.  BL Crt normal in November. Crt 2.1 on 1/6 and now 4.4. Electrolytes okay. Does have leukocytosis with neutrophilic predominance. No eosinophilia. UA on 1/6 w/ RBCs, WBCs, and protein. Suggested ibuprofen and keflex. UCx had no growth. CT renal study without stones or hydronephrosis.  UA today with no proteinuria but RBCs and WBCs; WBC casts also noted. Less RBCs than on 1/6.  This could be AIN from NSAIDs or a GN; although odd no protein today. WBC casts can also be seen in infection so would continue presumptive treatment for UTI for now. We will eval further whenever he is transferred. Hold further NSAIDs.

## 2020-07-22 NOTE — ED Notes (Signed)
[facility] arrived before labs drawn

## 2020-07-22 NOTE — Progress Notes (Signed)
Patient arrived on unit via stretcher by PTAR. Alert and in stable condition. MD notified, orders placed and carried out. Oriented to room, call light and bed in lowest position.

## 2020-07-22 NOTE — ED Notes (Signed)
Pt given oral contrast to drink; per discussion w/ EDMD, pt will be scanned after 1 hour....ET 1230

## 2020-07-22 NOTE — ED Notes (Signed)
Specimen collection device provided to patient.  

## 2020-07-22 NOTE — Progress Notes (Signed)
Patient complained of discomfort from foley cath. States he cannot tolerate it. Catheter's bulb deflated, tube adjusted and re inflated per MD's request. Patient still complained and insisted of removing foley. Foley removed as requested.

## 2020-07-22 NOTE — Plan of Care (Signed)
I have not seen the patient, this is not a plan, just to help facilitate passage of information    45 year old obese male  On January 6 the page patient presented to the emergency department with chills, backaches and was thought to have pyelonephritis and was started on Rocephin and then received a course of Keflex.  Creatinine was up to 2.09 and UA was with proteinuria, CT renal stone was done without evidence of UTI.  Patient return to the emergency department was found to have a creatinine of 4.44.   He was taking NSAIDs before and after.    In the [facility] emergency department, patient was initially hypertensive, 98.9, HR 87, 98% on room air, RR 18.  WBC 13.7, Hb 14.2, PLT 334, NA 137, K4.0, CO2 22, creatinine of 4.44, calcium of 8.4, glucose 112, lactic acid and blood cultures pending.  UA shows 6-10 RBCs and was 21-50 RBCs 3 days ago.  COVID was negative.  Previous ED visit urine culture was negative CT scan of the abdomen pelvis was done showing diffuse fatty change of the liver and previous mesh hernia repair in the lower abdominal and inguinal regions and otherwise negative, kidneys are normal, bladder is normal.  They contacted nephrology in the emergency department  AKI probably nephrology consult and would prefer him to come to [facility] for potential kidney biopsy if needed.  with concern for possibly GN with hematuria and protein but no RBC casts mentioned on 1/6 but ofcourse defer this thought process to nephrology.  or AIN with wbc casts. --I asked to do daily weights, strict i/o's, place foley and document if anything comes out --consider urine eosinophils and urine sodium at the beginning of this process I thought would help --CK, UNa, UCr --any new meds started within past 6 months? --with back pain wonder if a Renal ultrasound would be more helpful then a CT scan but not sure.

## 2020-07-22 NOTE — ED Notes (Signed)
2 x CX drawn

## 2020-07-23 ENCOUNTER — Telehealth: Payer: Self-pay

## 2020-07-23 ENCOUNTER — Inpatient Hospital Stay (HOSPITAL_COMMUNITY): Payer: BC Managed Care – PPO

## 2020-07-23 DIAGNOSIS — N179 Acute kidney failure, unspecified: Secondary | ICD-10-CM | POA: Diagnosis not present

## 2020-07-23 DIAGNOSIS — R109 Unspecified abdominal pain: Secondary | ICD-10-CM

## 2020-07-23 LAB — CBC WITH DIFFERENTIAL/PLATELET
Abs Immature Granulocytes: 0.03 10*3/uL (ref 0.00–0.07)
Basophils Absolute: 0.1 10*3/uL (ref 0.0–0.1)
Basophils Relative: 1 %
Eosinophils Absolute: 0.5 10*3/uL (ref 0.0–0.5)
Eosinophils Relative: 5 %
HCT: 42.6 % (ref 39.0–52.0)
Hemoglobin: 14.9 g/dL (ref 13.0–17.0)
Immature Granulocytes: 0 %
Lymphocytes Relative: 24 %
Lymphs Abs: 2.4 10*3/uL (ref 0.7–4.0)
MCH: 30.5 pg (ref 26.0–34.0)
MCHC: 35 g/dL (ref 30.0–36.0)
MCV: 87.3 fL (ref 80.0–100.0)
Monocytes Absolute: 1.1 10*3/uL — ABNORMAL HIGH (ref 0.1–1.0)
Monocytes Relative: 11 %
Neutro Abs: 5.9 10*3/uL (ref 1.7–7.7)
Neutrophils Relative %: 59 %
Platelets: 351 10*3/uL (ref 150–400)
RBC: 4.88 MIL/uL (ref 4.22–5.81)
RDW: 12.4 % (ref 11.5–15.5)
WBC: 10 10*3/uL (ref 4.0–10.5)
nRBC: 0 % (ref 0.0–0.2)

## 2020-07-23 LAB — GLUCOSE, CAPILLARY: Glucose-Capillary: 104 mg/dL — ABNORMAL HIGH (ref 70–99)

## 2020-07-23 LAB — COMPREHENSIVE METABOLIC PANEL
ALT: 32 U/L (ref 0–44)
AST: 24 U/L (ref 15–41)
Albumin: 3.3 g/dL — ABNORMAL LOW (ref 3.5–5.0)
Alkaline Phosphatase: 71 U/L (ref 38–126)
Anion gap: 13 (ref 5–15)
BUN: 37 mg/dL — ABNORMAL HIGH (ref 6–20)
CO2: 21 mmol/L — ABNORMAL LOW (ref 22–32)
Calcium: 8.8 mg/dL — ABNORMAL LOW (ref 8.9–10.3)
Chloride: 106 mmol/L (ref 98–111)
Creatinine, Ser: 3.54 mg/dL — ABNORMAL HIGH (ref 0.61–1.24)
GFR, Estimated: 21 mL/min — ABNORMAL LOW (ref >60)
Glucose, Bld: 91 mg/dL (ref 70–99)
Potassium: 3.9 mmol/L (ref 3.5–5.1)
Sodium: 140 mmol/L (ref 135–145)
Total Bilirubin: 0.6 mg/dL (ref 0.3–1.2)
Total Protein: 7.6 g/dL (ref 6.5–8.1)

## 2020-07-23 LAB — SEDIMENTATION RATE: Sed Rate: 40 mm/hr — ABNORMAL HIGH (ref 0–16)

## 2020-07-23 LAB — URINE CULTURE: Culture: NO GROWTH

## 2020-07-23 MED ORDER — PANTOPRAZOLE SODIUM 40 MG PO TBEC
40.0000 mg | DELAYED_RELEASE_TABLET | Freq: Every day | ORAL | Status: DC
  Administered 2020-07-23 – 2020-07-24 (×2): 40 mg via ORAL
  Filled 2020-07-23 (×2): qty 1

## 2020-07-23 NOTE — Telephone Encounter (Signed)
-----   Message from Rachael Fee, MD sent at 07/23/2020  3:14 PM EST ----- Can you cancel his screening colonoscopy for later this month and instead OV with me in 5-6 weeks for hospital follow up .  Thanks

## 2020-07-23 NOTE — Progress Notes (Signed)
PROGRESS NOTE  John Adams  DOB: 11-01-1975  PCP: Maud Deed, [place] JOA:[id number]  DOA: 07/22/2020  LOS: 1 day   Chief Complaint  Patient presents with  . Abdominal Pain    Brief narrative: John Adams is a 45 y.o. male with PMH significant for obesity, asthma. Patient presented to the ED on 1/9 with persistent back pain. Patient also reports that for the last several months he has GERD, food regurgitation, anorexia, epigastric pain.  He was taking ibuprofen on a regular basis to alleviate the pain.  He also has been dealing with chronic cough for which he got evaluated by ENT while in [place] recently.  In the ED, patient was hemodynamically stable. Labs remarkable for creatinine of 4.4. CT scan of the abdomen pelvis showed diffuse fatty change of the liver.   Patient was admitted to hospitalist service Nephrology was consulted.  Subjective: Patient was seen and examined this morning.  Sitting over the edge of the bed.  Not in distress.  Significant other at bedside. Patient also states to me that the last several months, he has epigastric pain, belching, regurgitation.  Never had GI evaluation before.  Assessment/Plan: AKI -Probably secondary to much use of NSAIDs. -Nephrology consulted. Recent Labs    07/19/20 1653 07/22/20 1012 07/22/20 1950 07/23/20 0655  BUN 28* 46* 41* 37*  CREATININE 2.09* 4.44* 4.04* 3.54*   Rule out acid peptic disease -Symptoms as above suggestive of undiagnosed acid peptic disease. -May benefit from EGD.  Inpatient versus outpatient -Hemoglobin stable. -GI consulted. Recent Labs    07/19/20 1653 07/22/20 1012 07/23/20 0655  HGB 14.7 14.2 14.9  MCV 89.9 88.6 87.3   Diffuse fatty liver -Per CT scan, probably obesity related. -Liver enzymes normal. -Unclear if this is the source of diffuse pain. Recent Labs  Lab 07/19/20 1653 07/22/20 1012 07/23/20 0655  AST 24 18 24   ALT 43 24 32  ALKPHOS 81 72 71  BILITOT 0.5 0.5  0.6  PROT 7.9 7.2 7.6  ALBUMIN 4.1 3.4* 3.3*   Chronic cough -Probably related to reflux itself.  Patient states that he had ENT evaluation in which was essentially normal.  Mobility: Encourage ambulation Code Status:   Code Status: Full Code  Nutritional status: Body mass index is 36.48 kg/m.     Diet Order            Diet renal with fluid restriction Fluid restriction: Other (see comments); Room service appropriate? Yes; Fluid consistency: Thin  Diet effective now                 DVT prophylaxis: heparin injection 5,000 Units Start: 07/22/20 2200 Place TED hose Start: 07/22/20 1805   Antimicrobials:  None Fluid: None.  Discussed with nephrology Consultants: Nephrologist Family Communication:  Significant other at bedside  Status is: Inpatient  Remains inpatient appropriate because: Ongoing renal recovery.  Noted back to baseline  Dispo: The patient is from: Home              Anticipated d/c is to: Home              Anticipated d/c date is: 2 days              Patient currently is not medically stable to d/c.       Infusions:  . sodium chloride Stopped (07/22/20 1639)    Scheduled Meds: . fentaNYL (SUBLIMAZE) injection  100 mcg Intravenous Once  . heparin  5,000 Units Subcutaneous Q8H  Antimicrobials: Anti-infectives (From admission, onward)   Start     Dose/Rate Route Frequency Ordered Stop   07/22/20 1315  cefTRIAXone (ROCEPHIN) 1 g in sodium chloride 0.9 % 100 mL IVPB        1 g 200 mL/hr over 30 Minutes Intravenous  Once 07/22/20 1313 07/22/20 1427      PRN meds: sodium chloride, acetaminophen **OR** acetaminophen, albuterol   Objective: Vitals:   07/23/20 0419 07/23/20 0725  BP: 131/89 (!) 149/92  Pulse: 62 63  Resp: 20 18  Temp: 98 F (36.7 C) 98 F (36.7 C)  SpO2: 97% 99%    Intake/Output Summary (Last 24 hours) at 07/23/2020 1144 Last data filed at 07/23/2020 0908 Gross per 24 hour  Intake 2314.64 ml  Output 2635 ml   Net -320.36 ml   Filed Weights   07/22/20 0948 07/23/20 0500  Weight: 95.2 kg 93.4 kg   Weight change:  Body mass index is 36.48 kg/m.   Physical Exam: General exam: Pleasant, not in distress Skin: No rashes, lesions or ulcers. HEENT: Atraumatic, normocephalic, no obvious bleeding Lungs: Clear to auscultation bilaterally CVS: Regular rate and rhythm, no murmur GI/Abd soft, nontender, nondistended, bowels are present CNS: Alert, awake, oriented x3 Psychiatry: Mood appropriate Extremities: No pedal edema, no calf tenderness  Data Review: I have personally reviewed the laboratory data and studies available.  Recent Labs  Lab 07/19/20 1653 07/22/20 1012 07/23/20 0655  WBC 16.4* 13.7* 10.0  NEUTROABS 11.8* 9.3* 5.9  HGB 14.7 14.2 14.9  HCT 44.4 41.8 42.6  MCV 89.9 88.6 87.3  PLT 356 334 351   Recent Labs  Lab 07/19/20 1653 07/22/20 1012 07/22/20 1950 07/23/20 0655  NA 138 137 135 140  K 4.5 4.0 3.8 3.9  CL 101 104 103 106  CO2 26 22 18* 21*  GLUCOSE 117* 112* 95 91  BUN 28* 46* 41* 37*  CREATININE 2.09* 4.44* 4.04* 3.54*  CALCIUM 10.1 8.4* 8.4* 8.8*    F/u labs ordered  Signed, Lorin Glass, MD [facility] 07/23/2020

## 2020-07-23 NOTE — Consult Note (Addendum)
Referring Provider:  [facility]         Primary Care Physician:  Willeen Niece, PA Primary Gastroenterologist:   unassigned          We were asked to see this patient for:    Abdominal pain              ASSESSMENT / PLAN:    45 yo male admitted with back pain radiating around to abdomen, mainly LUQ with some discomfort radiating toward LLQ. He took Ibuprofen for three days for this pain, he denies NSAID use leading up to onset of symptoms. He has had recent constipation but not leading up to the onset of symptoms. Non-contrast CT scan unremarkable. Lipase and LFTs normal.   Etiology of symptoms unclear but not convincing for GI cause. He clearly has something going on with elevated systemic inflammatory markers, leukocytosis.  --Biliary disease seems unlikely but will get an [place].  --+/- upper endoscopy.    # GERD with regurgitation.  --Trial of Protonix 40 gm BID      HPI:                                                                                                                             Chief Complaint:  Abdominal pain   John Adams is a 45 y.o. male with PMH significant for obesity, asthma and chronic cough. He presented to [facility] on 07/19/20 with back pain radiating around to abdomen, chills. WBC was 16 K. Cr was 2.09. He sent home on antibiotics for presumed UTI. No acute findings on CT scan  ( renal protocol) . LFTs,were normal.   Patient returned to ED yesterday with chills, fever, and persistent back pain radiating around to abdomen. He had worsening AKI with creatinine was 4.4. WBC down to 13.7. ESR  40, CRP 8. A repeat non-contrast CT scan yesterday was again without any acute findings.   [facility] Nephrology has evaluated. AKI felt to be secondary to AIN or UTI.  Urine culture negative at day 1.   Patient says he developed mid to lower back pain on Wednesday. The pain radiated around to his abdomen. No nausea /  vomiting. He started taking Ibuprofen for the pain and took it TID for about three days . Patient says he was NOT taking NSAIDS prior to onset of abdominal / back pain. Patient has occasional episodes of generalized abdominal pain relieved with belching, flatus and / or BMs. This abdominal pain is different as it is more constant. Belching / flatus help but pain quickly returns. Additionally he has discomfort in the LUQ when he takes a deep breath.  The back pain has resolved. Patient says he has not had a BM in a few days which is unusual for him. However, he was having normal BMs leading up to the onset of back pain / abdominal pain     PREVIOUS  ENDOSCOPIC EVALUATIONS / PERTINENT STUDIES   none    Past Medical History:  Diagnosis Date  . Asthma     Past Surgical History:  Procedure Laterality Date  . HERNIA REPAIR      Prior to Admission medications   Medication Sig Start Date End Date Taking? Authorizing Provider  cephALEXin (KEFLEX) 500 MG capsule Take 1 capsule (500 mg total) by mouth 2 (two) times daily for 7 days. 07/19/20 07/26/20 Yes Garald Balding, PA-C  ibuprofen (ADVIL) 200 MG tablet Take 800 mg by mouth 3 (three) times daily as needed (pain).   Yes [provider]  montelukast (SINGULAIR) 10 MG tablet Take 10 mg by mouth 3 (three) times a week.   Yes [provider]  albuterol (VENTOLIN HFA) 108 (90 Base) MCG/ACT inhaler Inhale 2 puffs into the lungs every 6 (six) hours as needed. Patient not taking: No sig reported 03/04/19   [provider]  ibuprofen (ADVIL) 800 MG tablet Take 1 tablet (800 mg total) by mouth 3 (three) times daily. Patient not taking: No sig reported 07/19/20   Garald Balding, PA-C    Current Facility-Administered Medications  Medication Dose Route Frequency Provider Last Rate Last Admin  . 0.9 %  sodium chloride infusion   Intravenous PRN Gareth Morgan, MD   Stopped at 07/22/20 1639  . acetaminophen (TYLENOL) tablet 650 mg   650 mg Oral Q6H PRN Sueanne Margarita, DO       Or  . acetaminophen (TYLENOL) suppository 650 mg  650 mg Rectal Q6H PRN Sueanne Margarita, DO      . albuterol (VENTOLIN HFA) 108 (90 Base) MCG/ACT inhaler 2 puff  2 puff Inhalation Q6H PRN Sueanne Margarita, DO      . fentaNYL (SUBLIMAZE) injection 100 mcg  100 mcg Intravenous Once Gareth Morgan, MD      . heparin injection 5,000 Units  5,000 Units Subcutaneous Q8H Sueanne Margarita, DO   5,000 Units at 07/23/20 1314    Allergies as of 07/22/2020  . (No Known Allergies)    Family History  Problem Relation Age of Onset  . Colon cancer Neg Hx   . Colon polyps Neg Hx   . Esophageal cancer Neg Hx   . Rectal cancer Neg Hx   . Stomach cancer Neg Hx     Social History   Socioeconomic History  . Marital status: Married    Spouse name: Not on file  . Number of children: Not on file  . Years of education: Not on file  . Highest education level: Not on file  Occupational History  . Not on file  Tobacco Use  . Smoking status: Never Smoker  . Smokeless tobacco: Never Used  Vaping Use  . Vaping Use: Never used  Substance and Sexual Activity  . Alcohol use: Yes    Comment: occ  . Drug use: Never  . Sexual activity: Not on file  Other Topics Concern  . Not on file  Social History Narrative  . Not on file   Social Determinants of Health   Financial Resource Strain: Not on file  Food Insecurity: Not on file  Transportation Needs: Not on file  Physical Activity: Not on file  Stress: Not on file  Social Connections: Not on file  Intimate Partner Violence: Not on file    Review of Systems: All systems reviewed and negative except where noted in HPI.  OBJECTIVE:    Physical Exam: Vital signs in last 24 hours: Temp:  [  98 F (36.7 C)-99 F (37.2 C)] 98 F (36.7 C) (01/10 1206) Pulse Rate:  [58-90] 61 (01/10 1206) Resp:  [18-20] 18 (01/10 1206) BP: (114-152)/(71-99) 136/87 (01/10 1206) SpO2:  [95 %-99 %] 98 % (01/10 1206) Weight:   [93.4 kg] 93.4 kg (01/10 0500) Last BM Date: 07/22/20 General:   Alert male in NAD Psych:  Pleasant, cooperative. Normal mood and affect. Eyes:  Pupils equal, sclera clear, no icterus.   Conjunctiva pink. Ears:  Normal auditory acuity. Nose:  No deformity, discharge,  or lesions. Neck:  Supple; no masses Lungs:  Clear throughout to auscultation.   No wheezes, crackles, or rhonchi.  Heart:  Regular rate and rhythm; no murmurs, no lower extremity edema Abdomen:  Soft, non-distended, nontender, BS active, no palp mass   Rectal:  Deferred  Msk:  Symmetrical without gross deformities. . Neurologic:  Alert and  oriented x4;  grossly normal neurologically. Skin:  Intact without significant lesions or rashes.  Filed Weights   07/22/20 0948 07/23/20 0500  Weight: 95.2 kg 93.4 kg     Scheduled inpatient medications . fentaNYL (SUBLIMAZE) injection  100 mcg Intravenous Once  . heparin  5,000 Units Subcutaneous Q8H      Intake/Output from previous day: 01/09 0701 - 01/10 0700 In: 1954.6 [P.O.:840; IV Piggyback:1114.6] Out: 1910 [Urine:1910] Intake/Output this shift: Total I/O In: 360 [P.O.:360] Out: 725 [Urine:725]   Lab Results: Recent Labs    07/22/20 1012 07/23/20 0655  WBC 13.7* 10.0  HGB 14.2 14.9  HCT 41.8 42.6  PLT 334 351   BMET Recent Labs    07/22/20 1012 07/22/20 1950 07/23/20 0655  NA 137 135 140  K 4.0 3.8 3.9  CL 104 103 106  CO2 22 18* 21*  GLUCOSE 112* 95 91  BUN 46* 41* 37*  CREATININE 4.44* 4.04* 3.54*  CALCIUM 8.4* 8.4* 8.8*   LFT Recent Labs    07/23/20 0655  PROT 7.6  ALBUMIN 3.3*  AST 24  ALT 32  ALKPHOS 71  BILITOT 0.6   PT/INR Recent Labs    07/22/20 1950  LABPROT 13.2  INR 1.0   Hepatitis Panel No results for input(s): HEPBSAG, HCVAB, HEPAIGM, HEPBIGM in the last 72 hours.   . CBC Latest Ref Rng & Units 07/23/2020 07/22/2020 07/19/2020  WBC 4.0 - 10.5 K/uL 10.0 13.7(H) 16.4(H)  Hemoglobin 13.0 - 17.0 g/dL 14.9 14.2 14.7   Hematocrit 39.0 - 52.0 % 42.6 41.8 44.4  Platelets 150 - 400 K/uL 351 334 356    . CMP Latest Ref Rng & Units 07/23/2020 07/22/2020 07/22/2020  Glucose 70 - 99 mg/dL 91 95 112(H)  BUN 6 - 20 mg/dL 37(H) 41(H) 46(H)  Creatinine 0.61 - 1.24 mg/dL 3.54(H) 4.04(H) 4.44(H)  Sodium 135 - 145 mmol/L 140 135 137  Potassium 3.5 - 5.1 mmol/L 3.9 3.8 4.0  Chloride 98 - 111 mmol/L 106 103 104  CO2 22 - 32 mmol/L 21(L) 18(L) 22  Calcium 8.9 - 10.3 mg/dL 8.8(L) 8.4(L) 8.4(L)  Total Protein 6.5 - 8.1 g/dL 7.6 - 7.2  Total Bilirubin 0.3 - 1.2 mg/dL 0.6 - 0.5  Alkaline Phos 38 - 126 U/L 71 - 72  AST 15 - 41 U/L 24 - 18  ALT 0 - 44 U/L 32 - 24   Studies/Results: CT ABDOMEN PELVIS WO CONTRAST  Addendum Date: 07/22/2020   ADDENDUM REPORT: 07/22/2020 13:04 ADDENDUM: Comparison study is a CT. Electronically Signed   By: Nelson Chimes M.D.   On:  07/22/2020 13:04   Result Date: 07/22/2020 CLINICAL DATA:  Epigastric abdominal pain beginning last week. Negative abdominal ultrasound 3 days ago, other than fatty liver. EXAM: CT ABDOMEN AND PELVIS WITHOUT CONTRAST TECHNIQUE: Multidetector CT imaging of the abdomen and pelvis was performed following the standard protocol without IV contrast. COMPARISON:  Ultrasound 07/19/2020 FINDINGS: Lower chest: Normal Hepatobiliary: Diffuse fatty change of the liver. This can be associated with pain. No calcified gallstones. Pancreas: Normal Spleen: Normal Adrenals/Urinary Tract: Adrenal glands are normal. Kidneys are normal. No cyst, mass, stone or hydronephrosis. Bladder is normal. Stomach/Bowel: Stomach and small intestine are normal. Appendix is normal. No colon pathology visible. Vascular/Lymphatic: Normal Reproductive: Normal Other: No free fluid or air. Previous mesh hernia repair in the lower abdominal and inguinal regions. Musculoskeletal: Normal IMPRESSION: 1. Diffuse fatty change of the liver. This can be associated with pain. No calcified gallstones. 2. Previous mesh hernia  repair in the lower abdominal and inguinal regions. 3. Otherwise negative scan. Electronically Signed: By: Nelson Chimes M.D. On: 07/22/2020 12:52    Active Problems:   Acute kidney injury ([place])    Tye Savoy, NP-C @  07/23/2020, 1:38 PM  ________________________________________________________________________  [facility] Heckler [facility] MD note:  I personally examined the patient, reviewed the data and agree with the assessment and plan described above.  His symptoms seem unlikely to be due to a GI problem.  His biggest complaint was severe backpain, then UGI dyspeptic issues after he started taking NSAIDs.  I agree with empiric trial of BID PPI and getting an [place].  He is safe from d/c from my perspective.  My office will arrange office appointment in 4-5 weeks and will cancel his screening colonoscopy for now as well that is currently scheduled for 1/20 I believe.  Owens Loffler, MD [facility] Pager [phone]

## 2020-07-23 NOTE — Consult Note (Signed)
Nephrology Consult  [facility]  Requesting provider: Lorin Glass, MD  Assessment/Recommendations:  AKI, improving: Likely secondary to acute interstitial nephritis or UTI, however would need a biopsy to prove this. Given that potential culprits have been stopped (NSAIDs, keflex), may be able to hold off. If Cr worsens then would have a low threshold to pursue a biopsy.  Of note, WBC casts can be seen with UTIs with -normal baseline kidney function -encouraged him to hydrate to thirst -no indication for renal replacement therapy at this time -Continue to monitor daily Cr, Dose meds for GFR<15 -Monitor Daily I/Os, Daily weight  -Maintain MAP>65 for optimal renal perfusion.  -avoid further nephrotoxins including NSAIDS, Morphine.  Unless absolutely necessary, avoid CT with contrast and/or MRI with gadolinium.     Epigastric pain, likely GERD -GI consulted to see if he needs an EGD while in-house, concern for PUDD  Possible UTI: Antibiotics per primary service  Chronic cough -likely related to GERD   Recommendations conveyed to primary service.    Anthony Sar [facility] 07/23/2020 12:42 PM   _____________________________________________________________________________________   History of Present Illness: John Adams is a 45 y.o. male with a past medical history of obesity, asthma who presents to [facility] with persistent back pain.  He was initially seen at [facility].  There were concerns for Lyrica And/or UTI.  Imaging was negative.  Was found to have a creatinine of 2.  We presented to the ER here for persistent pain.  Has been taking ibuprofen at least 3 times a day to alleviate the pain.  His back pain did eventually improve, however his biggest complaint is his chronic issue which is abdominal pain/epigastric pain, indigestion, sour taste, food regurgitation. In the ER was found to have a creatinine of 4.4.  Urinalysis did reveal WBC casts with  many bacteria. Denies any fevers, chills, chest pain, SOB, dizziness, changes in urinary frequency, rash.   Medications:  Current Facility-Administered Medications  Medication Dose Route Frequency Provider Last Rate Last Admin  . 0.9 %  sodium chloride infusion   Intravenous PRN Alvira Monday, MD   Stopped at 07/22/20 1639  . acetaminophen (TYLENOL) tablet 650 mg  650 mg Oral Q6H PRN Charlane Ferretti, DO       Or  . acetaminophen (TYLENOL) suppository 650 mg  650 mg Rectal Q6H PRN Charlane Ferretti, DO      . albuterol (VENTOLIN HFA) 108 (90 Base) MCG/ACT inhaler 2 puff  2 puff Inhalation Q6H PRN Charlane Ferretti, DO      . fentaNYL (SUBLIMAZE) injection 100 mcg  100 mcg Intravenous Once Alvira Monday, MD      . heparin injection 5,000 Units  5,000 Units Subcutaneous Q8H Charlane Ferretti, DO   5,000 Units at 07/23/20 7591     ALLERGIES Patient has no known allergies.  MEDICAL HISTORY Past Medical History:  Diagnosis Date  . Asthma      SOCIAL HISTORY Social History   Socioeconomic History  . Marital status: Married    Spouse name: Not on file  . Number of children: Not on file  . Years of education: Not on file  . Highest education level: Not on file  Occupational History  . Not on file  Tobacco Use  . Smoking status: Never Smoker  . Smokeless tobacco: Never Used  Vaping Use  . Vaping Use: Never used  Substance and Sexual Activity  . Alcohol use: Yes    Comment: occ  . Drug use: Never  .  Sexual activity: Not on file  Other Topics Concern  . Not on file  Social History Narrative  . Not on file   Social Determinants of Health   Financial Resource Strain: Not on file  Food Insecurity: Not on file  Transportation Needs: Not on file  Physical Activity: Not on file  Stress: Not on file  Social Connections: Not on file  Intimate Partner Violence: Not on file     FAMILY HISTORY Family History  Problem Relation Age of Onset  . Colon cancer Neg Hx   . Colon polyps  Neg Hx   . Esophageal cancer Neg Hx   . Rectal cancer Neg Hx   . Stomach cancer Neg Hx      Review of Systems: 12 systems reviewed Otherwise as per HPI, all other systems reviewed and negative  Physical Exam: Vitals:   07/23/20 0725 07/23/20 1206  BP: (!) 149/92 136/87  Pulse: 63 61  Resp: 18 18  Temp: 98 F (36.7 C) 98 F (36.7 C)  SpO2: 99% 98%   Total I/O In: 360 [P.O.:360] Out: 725 [Urine:725]  Intake/Output Summary (Last 24 hours) at 07/23/2020 1242 Last data filed at 07/23/2020 0908 Gross per 24 hour  Intake 1314.64 ml  Output 2635 ml  Net -1320.36 ml   General: well-appearing, no acute distress HEENT: anicteric sclera, oropharynx clear without lesions CV: regular rate, normal rhythm, no murmurs, no gallops, no rubs, no peripheral edema Lungs: clear to auscultation bilaterally, normal work of breathing Abd: soft, non-tender, non-distended Skin: no visible lesions or rashes Psych: alert, engaged, appropriate mood and affect Musculoskeletal: no obvious deformities Neuro: normal speech, no gross focal deficits   Test Results Reviewed Lab Results  Component Value Date   NA 140 07/23/2020   K 3.9 07/23/2020   CL 106 07/23/2020   CO2 21 (L) 07/23/2020   BUN 37 (H) 07/23/2020   CREATININE 3.54 (H) 07/23/2020   CALCIUM 8.8 (L) 07/23/2020   ALBUMIN 3.3 (L) 07/23/2020     I have reviewed all relevant outside healthcare records related to the patient's kidney injury.

## 2020-07-23 NOTE — Progress Notes (Signed)
Patient is transferred from room 3C09 to unit 5C20 at this time. Alert and in stable condition. Report given to receiving nurse Windell Moulding, RN with all questions answered. Left unit via bed with spouse and all belongings at side.

## 2020-07-23 NOTE — Telephone Encounter (Signed)
The procedure has been cancelled and an appt has been made for 09/03/20 at 1010 am with Dr Christella Hartigan. The pt will be advised of the appt at discharge

## 2020-07-24 DIAGNOSIS — N179 Acute kidney failure, unspecified: Secondary | ICD-10-CM | POA: Diagnosis not present

## 2020-07-24 LAB — BASIC METABOLIC PANEL
Anion gap: 12 (ref 5–15)
BUN: 28 mg/dL — ABNORMAL HIGH (ref 6–20)
CO2: 24 mmol/L (ref 22–32)
Calcium: 9.2 mg/dL (ref 8.9–10.3)
Chloride: 104 mmol/L (ref 98–111)
Creatinine, Ser: 2.66 mg/dL — ABNORMAL HIGH (ref 0.61–1.24)
GFR, Estimated: 29 mL/min — ABNORMAL LOW (ref >60)
Glucose, Bld: 98 mg/dL (ref 70–99)
Potassium: 4.2 mmol/L (ref 3.5–5.1)
Sodium: 140 mmol/L (ref 135–145)

## 2020-07-24 LAB — GLUCOSE, CAPILLARY: Glucose-Capillary: 111 mg/dL — ABNORMAL HIGH (ref 70–99)

## 2020-07-24 LAB — CBC WITH DIFFERENTIAL/PLATELET
Abs Immature Granulocytes: 0.04 10*3/uL (ref 0.00–0.07)
Basophils Absolute: 0.1 10*3/uL (ref 0.0–0.1)
Basophils Relative: 1 %
Eosinophils Absolute: 0.6 10*3/uL — ABNORMAL HIGH (ref 0.0–0.5)
Eosinophils Relative: 6 %
HCT: 41.6 % (ref 39.0–52.0)
Hemoglobin: 13.6 g/dL (ref 13.0–17.0)
Immature Granulocytes: 0 %
Lymphocytes Relative: 27 %
Lymphs Abs: 2.7 10*3/uL (ref 0.7–4.0)
MCH: 29.3 pg (ref 26.0–34.0)
MCHC: 32.7 g/dL (ref 30.0–36.0)
MCV: 89.7 fL (ref 80.0–100.0)
Monocytes Absolute: 1.4 10*3/uL — ABNORMAL HIGH (ref 0.1–1.0)
Monocytes Relative: 14 %
Neutro Abs: 5.2 10*3/uL (ref 1.7–7.7)
Neutrophils Relative %: 52 %
Platelets: 372 10*3/uL (ref 150–400)
RBC: 4.64 MIL/uL (ref 4.22–5.81)
RDW: 12.3 % (ref 11.5–15.5)
WBC: 10 10*3/uL (ref 4.0–10.5)
nRBC: 0 % (ref 0.0–0.2)

## 2020-07-24 MED ORDER — PANTOPRAZOLE SODIUM 40 MG PO TBEC: 40.0000 mg | DELAYED_RELEASE_TABLET | Freq: Two times a day (BID) | ORAL | 2 refills | Status: DC

## 2020-07-24 MED ORDER — PANTOPRAZOLE SODIUM 40 MG PO TBEC
40.0000 mg | DELAYED_RELEASE_TABLET | Freq: Two times a day (BID) | ORAL | Status: DC
Start: 2020-07-24 — End: 2020-07-24

## 2020-07-24 NOTE — Discharge Summary (Signed)
Physician Discharge Summary  John Adams ZOX:[id number] DOB: 10/24/1975 DOA: 07/22/2020  PCP: Maud Deed, PA  Admit date: 07/22/2020 Discharge date: 07/24/2020  Admitted From: Home Discharge disposition: Home   Code Status: Full Code  Diet Recommendation: Regular diet  Discharge Diagnosis:   Active Problems:   Acute kidney injury [facility] [place])   Chief Complaint  Patient presents with  . Abdominal Pain    Brief narrative: John Adams is a 45 y.o. male with PMH significant for obesity, asthma. Patient presented to the ED on 1/9 with persistent back pain. Patient also reports that for the last several months he has GERD, food regurgitation, anorexia, epigastric pain.  He was taking ibuprofen on a regular basis to alleviate the pain.  He also has been dealing with chronic cough for which he got evaluated by ENT while in [place] recently.  In the ED, patient was hemodynamically stable. Labs remarkable for creatinine of 4.4. CT scan of the abdomen pelvis showed diffuse fatty change of the liver.   Patient was admitted to hospitalist service Nephrology was consulted.  Subjective: Patient was seen and examined this morning.   Lying on bed.  Not in distress.  Pain improving.  Continues to have mild epigastric pain and belching but better than before.  GI consult appreciated.  Outpatient evaluation recommended.   Creatinine this morning continues to.  Assessment/Plan: AKI -Elevated creatinine and abnormal urinalysis on admission were probably secondary to much use of NSAIDs. -Nephrology consulted. -Creatinine improving.  Not on IV fluids. Oral hydration to continue. -Patient to follow-up with PCP next 1 week to repeat creatinine. -He also has a follow-up with nephrologist. Recent Labs    07/19/20 1653 07/22/20 1012 07/22/20 1950 07/23/20 0655 07/24/20 0438  BUN 28* 46* 41* 37* 28*  CREATININE 2.09* 4.44* 4.04* 3.54* 2.66*   Symptoms suggestive of acid peptic  disease -Symptoms as above suggestive of undiagnosed acid peptic disease. -GI evaluation obtained.  Started on PPI as recommended.  Outpatient follow-up in next few weeks. -Hemoglobin stable. -GI consulted. Recent Labs    07/19/20 1653 07/22/20 1012 07/23/20 0655 07/24/20 0438  HGB 14.7 14.2 14.9 13.6  MCV 89.9 88.6 87.3 89.7   Diffuse fatty liver -Per CT scan, probably obesity related. -Liver enzymes normal. Recent Labs  Lab 07/19/20 1653 07/22/20 1012 07/23/20 0655  AST 24 18 24   ALT 43 24 32  ALKPHOS 81 72 71  BILITOT 0.5 0.5 0.6  PROT 7.9 7.2 7.6  ALBUMIN 4.1 3.4* 3.3*   Chronic cough -Probably related to GERD itself.  Patient states that he had ENT evaluation in [place] which was essentially normal.  Wound care:    Discharge Exam:   Vitals:   07/23/20 1648 07/23/20 2346 07/24/20 0511 07/24/20 1209  BP: (!) 138/91 (!) 138/94 118/88 136/87  Pulse: 60 72 66 70  Resp:  18 18 17   Temp: 98.4 F (36.9 C) 98.6 F (37 C) 98.6 F (37 C) 98 F (36.7 C)  TempSrc: Oral Oral Oral Oral  SpO2: 97% 98% 97% 99%  Weight:   90.9 kg   Height:        Body mass index is 35.52 kg/m.  General exam: Pleasant, young Caucasian male.  Not in distress Skin: No rashes, lesions or ulcers. HEENT: Atraumatic, normocephalic, no obvious bleeding Lungs: Clear to auscultation bilaterally CVS: Regular rate and rhythm, no murmur GI/Abd soft, nontender, nondistended, bowel sound present CNS: Alert, awake, oriented x3 Psychiatry: Mood appropriate Extremities: No pedal edema, no calf  tenderness  Follow ups:   Discharge Instructions    Diet general   Complete by: As directed    Increase activity slowly   Complete by: As directed       Follow-up Information    Maud DeedHowley, Desiree, PA Follow up.   Specialty: Physician Assistant Contact information: [address] [place] [phone]               Recommendations for Outpatient  Follow-Up:   1. Follow-up with PCP, nephrology, GI as an outpatient  Discharge Instructions:  Follow with Primary MD Maud DeedHowley, Desiree, PA in 7 days   Get CBC/BMP checked in next visit within 1 week by PCP or SNF MD ( we routinely change or add medications that can affect your baseline labs and fluid status, therefore we recommend that you get the mentioned basic workup next visit with your PCP, your PCP may decide not to get them or add new tests based on their clinical decision)  On your next visit with your PCP, please Get Medicines reviewed and adjusted.  Please request your PCP  to go over all Hospital Tests and Procedure/Radiological results at the follow up, please get all Hospital records sent to your Prim MD by signing hospital release before you go home.  Activity: As tolerated with Full fall precautions use walker/cane & assistance as needed  For Heart failure patients - Check your Weight same time everyday, if you gain over 2 pounds, or you develop in leg swelling, experience more shortness of breath or chest pain, call your Primary MD immediately. Follow Cardiac Low Salt Diet and 1.5 lit/day fluid restriction.  If you have smoked or chewed Tobacco in the last 2 yrs please stop smoking, stop any regular Alcohol  and or any Recreational drug use.  If you experience worsening of your admission symptoms, develop shortness of breath, life threatening emergency, suicidal or homicidal thoughts you must seek medical attention immediately by calling 911 or calling your MD immediately  if symptoms less severe.  You Must read complete instructions/literature along with all the possible adverse reactions/side effects for all the Medicines you take and that have been prescribed to you. Take any new Medicines after you have completely understood and accpet all the possible adverse reactions/side effects.   Do not drive, operate heavy machinery, perform activities at heights, swimming or  participation in water activities or provide baby sitting services if your were admitted for syncope or siezures until you have seen by Primary MD or a Neurologist and advised to do so again.  Do not drive when taking Pain medications.  Do not take more than prescribed Pain, Sleep and Anxiety Medications  Wear Seat belts while driving.   Please note You were cared for by a hospitalist during your hospital stay. If you have any questions about your discharge medications or the care you received while you were in the hospital after you are discharged, you can call the unit and asked to speak with the hospitalist on call if the hospitalist that took care of you is not available. Once you are discharged, your primary care physician will handle any further medical issues. Please note that NO REFILLS for any discharge medications will be authorized once you are discharged, as it is imperative that you return to your primary care physician (or establish a relationship with a primary care physician if you do not have one) for your aftercare needs so that they can reassess your need  for medications and monitor your lab values.    Allergies as of 07/24/2020   No Known Allergies     Medication List    STOP taking these medications   cephALEXin 500 MG capsule Commonly known as: KEFLEX   ibuprofen 200 MG tablet Commonly known as: ADVIL   ibuprofen 800 MG tablet Commonly known as: ADVIL     TAKE these medications   albuterol 108 (90 Base) MCG/ACT inhaler Commonly known as: VENTOLIN HFA Inhale 2 puffs into the lungs every 6 (six) hours as needed.   montelukast 10 MG tablet Commonly known as: SINGULAIR Take 10 mg by mouth 3 (three) times a week.   pantoprazole 40 MG tablet Commonly known as: PROTONIX Take 1 tablet (40 mg total) by mouth 2 (two) times daily before a meal.       Time coordinating discharge: 35 minutes  The results of significant diagnostics from this hospitalization  (including imaging, microbiology, ancillary and laboratory) are listed below for reference.    Procedures and Diagnostic Studies:   CT ABDOMEN PELVIS WO CONTRAST  Addendum Date: 07/22/2020   ADDENDUM REPORT: 07/22/2020 13:04 ADDENDUM: Comparison study is a CT. Electronically Signed   By: Paulina Fusi M.D.   On: 07/22/2020 13:04   Result Date: 07/22/2020 CLINICAL DATA:  Epigastric abdominal pain beginning last week. Negative abdominal ultrasound 3 days ago, other than fatty liver. EXAM: CT ABDOMEN AND PELVIS WITHOUT CONTRAST TECHNIQUE: Multidetector CT imaging of the abdomen and pelvis was performed following the standard protocol without IV contrast. COMPARISON:  Ultrasound 07/19/2020 FINDINGS: Lower chest: Normal Hepatobiliary: Diffuse fatty change of the liver. This can be associated with pain. No calcified gallstones. Pancreas: Normal Spleen: Normal Adrenals/Urinary Tract: Adrenal glands are normal. Kidneys are normal. No cyst, mass, stone or hydronephrosis. Bladder is normal. Stomach/Bowel: Stomach and small intestine are normal. Appendix is normal. No colon pathology visible. Vascular/Lymphatic: Normal Reproductive: Normal Other: No free fluid or air. Previous mesh hernia repair in the lower abdominal and inguinal regions. Musculoskeletal: Normal IMPRESSION: 1. Diffuse fatty change of the liver. This can be associated with pain. No calcified gallstones. 2. Previous mesh hernia repair in the lower abdominal and inguinal regions. 3. Otherwise negative scan. Electronically Signed: By: Paulina Fusi M.D. On: 07/22/2020 12:52   US Abdomen Limited RUQ (LIVER/GB)  Result Date: 07/23/2020 CLINICAL DATA:  Upper abdominal pain. EXAM: ULTRASOUND ABDOMEN LIMITED RIGHT UPPER QUADRANT COMPARISON:  Abdominal CT yesterday, as well as 07/19/2020 FINDINGS: Gallbladder: Partially distended. No gallstones or wall thickening visualized. No sonographic Murphy sign noted by sonographer. Common bile duct: Diameter: 4 mm.  Liver: Diffusely increased in parenchymal echogenicity. There is an area of focal fatty sparing adjacent to the gallbladder fossa that corresponds to that seen on CT. No discrete focal lesion. No capsular nodularity. Portal vein is patent on color Doppler imaging with normal direction of blood flow towards the liver. Other: No right upper quadrant ascites. IMPRESSION: 1. Normal sonographic appearance of the gallbladder and biliary tree. 2. Hepatic steatosis with focal fatty sparing adjacent to the gallbladder fossa. Electronically Signed   By: Narda Rutherford M.D.   On: 07/23/2020 23:29     Labs:   Basic Metabolic Panel: Recent Labs  Lab 07/19/20 1653 07/22/20 1012 07/22/20 1950 07/23/20 0655 07/24/20 0438  NA 138 137 135 140 140  K 4.5 4.0 3.8 3.9 4.2  CL 101 104 103 106 104  CO2 26 22 18* 21* 24  GLUCOSE 117* 112* 95 91 98  BUN 28* 46* 41* 37* 28*  CREATININE 2.09* 4.44* 4.04* 3.54* 2.66*  CALCIUM 10.1 8.4* 8.4* 8.8* 9.2   GFR Estimated Creatinine Clearance: 35 mL/min (A) (by C-G formula based on SCr of 2.66 mg/dL (H)). Liver Function Tests: Recent Labs  Lab 07/19/20 1653 07/22/20 1012 07/23/20 0655  AST 24 18 24   ALT 43 24 32  ALKPHOS 81 72 71  BILITOT 0.5 0.5 0.6  PROT 7.9 7.2 7.6  ALBUMIN 4.1 3.4* 3.3*   Recent Labs  Lab 07/22/20 1012  LIPASE 24   No results for input(s): AMMONIA in the last 168 hours. Coagulation profile Recent Labs  Lab 07/22/20 1950  INR 1.0    CBC: Recent Labs  Lab 07/19/20 1653 07/22/20 1012 07/23/20 0655 07/24/20 0438  WBC 16.4* 13.7* 10.0 10.0  NEUTROABS 11.8* 9.3* 5.9 5.2  HGB 14.7 14.2 14.9 13.6  HCT 44.4 41.8 42.6 41.6  MCV 89.9 88.6 87.3 89.7  PLT 356 334 351 372   Cardiac Enzymes: Recent Labs  Lab 07/22/20 1950  CKTOTAL 88   BNP: Invalid input(s): POCBNP CBG: Recent Labs  Lab 07/23/20 0723 07/24/20 0602  GLUCAP 104* 111*   D-Dimer No results for input(s): DDIMER in the last 72 hours. Hgb A1c No  results for input(s): HGBA1C in the last 72 hours. Lipid Profile No results for input(s): CHOL, HDL, LDLCALC, TRIG, CHOLHDL, LDLDIRECT in the last 72 hours. Thyroid function studies Recent Labs    07/22/20 1950  TSH 4.021   Anemia work up No results for input(s): VITAMINB12, FOLATE, FERRITIN, TIBC, IRON, RETICCTPCT in the last 72 hours. Microbiology Recent Results (from the past 240 hour(s))  Urine culture     Status: None   Collection Time: 07/19/20  4:53 PM   Specimen: Urine, Random  Result Value Ref Range Status   Specimen Description   Final    URINE, RANDOM Performed at [facility] [place], [address] [place], [place] [address]    Special Requests   Final    NONE Performed at [facility] [place], [address] [place], [place] [address]    Culture   Final    NO GROWTH Performed at [facility], [address] [place], [place] [address]    Report Status 07/21/2020 FINAL  Final  SARS CORONAVIRUS 2 (TAT 6-24 HRS) Nasopharyngeal Nasopharyngeal Swab     Status: None   Collection Time: 07/19/20  6:43 PM   Specimen: Nasopharyngeal Swab  Result Value Ref Range Status   SARS Coronavirus 2 NEGATIVE NEGATIVE Final    Comment: (NOTE) SARS-CoV-2 target nucleic acids are NOT DETECTED.  The SARS-CoV-2 RNA is generally detectable in upper and lower respiratory specimens during the acute phase of infection. Negative results do not preclude SARS-CoV-2 infection, do not rule out co-infections with other pathogens, and should not be used as the sole basis for treatment or other patient management decisions. Negative results must be combined with clinical observations, patient history, and epidemiological information. The expected result is Negative.  Fact Sheet for Patients: HairSlick.nohttps://www.fda.gov/media/138098/download  Fact Sheet for Healthcare Providers: quierodirigir.comhttps://www.fda.gov/media/138095/download  This test is not yet approved or cleared by the [place]  FDA and  has been authorized for detection and/or diagnosis of SARS-CoV-2 by FDA under an Emergency Use Authorization (EUA). This EUA will remain  in effect (meaning this test can be used) for the duration of the COVID-19 declaration under Se ction 564(b)(1) of the Act, 21 U.S.C. section 360bbb-3(b)(1), unless  the authorization is terminated or revoked sooner.  Performed at [facility], [address] [place], [place] [address]   Blood culture (routine x 2)     Status: None (Preliminary result)   Collection Time: 07/22/20 10:30 AM   Specimen: BLOOD  Result Value Ref Range Status   Specimen Description   Final    BLOOD LEFT ANTECUBITAL Performed at [facility], [address], [place], [place] [address]    Special Requests   Final    BOTTLES DRAWN AEROBIC AND ANAEROBIC Blood Culture adequate volume Performed at [facility], [address] [place], [place] [address]    Culture   Final    NO GROWTH 2 DAYS Performed at [facility], [address], [place], [place] [address]    Report Status PENDING  Incomplete  Blood culture (routine x 2)     Status: None (Preliminary result)   Collection Time: 07/22/20 10:35 AM   Specimen: BLOOD LEFT HAND  Result Value Ref Range Status   Specimen Description   Final    BLOOD LEFT HAND Performed at [facility], [address] [place], [place] [address]    Special Requests   Final    BOTTLES DRAWN AEROBIC AND ANAEROBIC Blood Culture adequate volume Performed at [facility], [address] [place], [place] [address]    Culture   Final    NO GROWTH 2 DAYS Performed at [facility], [address], [place], [place] [address]    Report Status PENDING  Incomplete  Urine culture     Status: None   Collection Time: 07/22/20 10:58 AM   Specimen: Urine, Random  Result Value Ref Range Status   Specimen Description   Final    URINE, RANDOM Performed at [facility], [address] [place], [place] [address]    Special Requests   Final    NONE Performed at [facility], [address] [place], [place] [address]    Culture   Final    NO GROWTH Performed at [facility], [address], [place], [place] [address]    Report Status 07/23/2020 FINAL  Final  Resp Panel by RT-PCR (Flu A&B, Covid) Nasopharyngeal Swab     Status: None   Collection Time: 07/22/20 12:11 PM   Specimen: Nasopharyngeal Swab; Nasopharyngeal(NP) swabs in vial transport medium  Result Value Ref Range Status   SARS Coronavirus 2 by RT PCR NEGATIVE NEGATIVE Final    Comment: (NOTE) SARS-CoV-2 target nucleic acids are NOT DETECTED.  The SARS-CoV-2 RNA is generally detectable in upper respiratory specimens during the acute phase of infection. The lowest concentration of SARS-CoV-2 viral copies this assay can detect is 138 copies/mL. A negative result does not preclude SARS-Cov-2 infection and should not be used as the sole basis for treatment or other patient management decisions. A negative result may occur with  improper specimen collection/handling, submission of specimen other than nasopharyngeal swab, presence of viral mutation(s) within the areas targeted by this assay, and inadequate number of viral copies(<138 copies/mL). A negative result must be combined with clinical observations, patient history, and epidemiological information. The expected result is Negative.  Fact Sheet for Patients:  BloggerCourse.com  Fact Sheet for Healthcare Providers:  SeriousBroker.it  This test is no t yet approved or cleared by the [place] FDA and  has been authorized for detection  and/or diagnosis of SARS-CoV-2 by FDA under an Emergency Use Authorization (EUA). This EUA will remain  in effect (meaning this test can be used) for the duration of the COVID-19 declaration under Section 564(b)(1) of the Act,  21 U.S.C.section 360bbb-3(b)(1), unless the authorization is terminated  or revoked sooner.       Influenza A by PCR NEGATIVE NEGATIVE Final   Influenza B by PCR NEGATIVE NEGATIVE Final    Comment: (NOTE) The Xpert Xpress SARS-CoV-2/FLU/RSV plus assay is intended as an aid in the diagnosis of influenza from Nasopharyngeal swab specimens and should not be used as a sole basis for treatment. Nasal washings and aspirates are unacceptable for Xpert Xpress SARS-CoV-2/FLU/RSV testing.  Fact Sheet for Patients: BloggerCourse.com  Fact Sheet for Healthcare Providers: SeriousBroker.it  This test is not yet approved or cleared by the [place] FDA and has been authorized for detection and/or diagnosis of SARS-CoV-2 by FDA under an Emergency Use Authorization (EUA). This EUA will remain in effect (meaning this test can be used) for the duration of the COVID-19 declaration under Section 564(b)(1) of the Act, 21 U.S.C. section 360bbb-3(b)(1), unless the authorization is terminated or revoked.  Performed at [facility], [address] [place], [place] [address]   MRSA PCR Screening     Status: Abnormal   Collection Time: 07/22/20  6:20 PM   Specimen: Nasopharyngeal  Result Value Ref Range Status   MRSA by PCR (A) NEGATIVE Final    INVALID, UNABLE TO DETERMINE THE PRESENCE OF TARGET DUE TO SPECIMEN INTEGRITY. RECOLLECTION REQUESTED.    Comment: Joesph Fillers RN 07/22/2020 2315 SKEEN,P Performed at [facility], [address] [place], [place] [address]      Signed: Lorin Glass  [facility] 07/24/2020, 1:46 PM

## 2020-07-24 NOTE — Progress Notes (Signed)
Pt received discharge instructions and does not have questions or concerns at this time. Pt is ready for discharge.

## 2020-07-24 NOTE — Progress Notes (Signed)
[facility] Progress Note    Assessment/ Plan:   AKI, improving: Peak Cr 4.4. Likely secondary to acute interstitial nephritis or UTI, however would need a biopsy to prove this. Given that potential culprits have been stopped (NSAIDs, keflex), may be able to hold off. If Cr worsens then would have a low threshold to pursue a biopsy.  Of note, WBC casts can be seen with UTIs -normal baseline kidney function -encouraged him to hydrate to thirst -no indication for renal replacement therapy at this time -Continue to monitor daily Cr, Dose meds for GFR<15 -Monitor Daily I/Os, Daily weight  -Maintain MAP>65 for optimal renal perfusion.  -avoid further nephrotoxins including NSAIDS, Morphine.  Unless absolutely necessary, avoid CT with contrast and/or MRI with gadolinium.     Epigastric pain, likely GERD -GI consulted, egd outpatient -on PPI  Possible UTI: Antibiotics per primary service  Chronic cough -likely related to GERD   Recommendations conveyed to primary service.   Will sign off from nephrology perspective. Ideally, needs to get labs done via PCP a few days after discharge. Will set him up for a follow up at our office in 3-4 weeks. Please call with any questions/concerns.  Subjective:   No complaints. abd pain better. No acute events.   Objective:   BP 136/87 (BP Location: Left Arm)   Pulse 70   Temp 98 F (36.7 C) (Oral)   Resp 17   Ht 5\' 3"  (1.6 m)   Wt 90.9 kg   SpO2 99%   BMI 35.52 kg/m   Intake/Output Summary (Last 24 hours) at 07/24/2020 1335 Last data filed at 07/24/2020 0516 Gross per 24 hour  Intake 262.21 ml  Output 1065 ml  Net -802.79 ml   Weight change: -4.264 kg  Physical Exam: Gen:nad CVS:reg rate Resp:normal wob 09/21/2020, soft, nt/nd Ext:no edema Neuro: no focal deficits Skin: no rash  Imaging: ZOX:WRUEA Abdomen Limited RUQ (LIVER/GB)  Result Date: 07/23/2020 CLINICAL DATA:  Upper abdominal pain. EXAM: ULTRASOUND ABDOMEN  LIMITED RIGHT UPPER QUADRANT COMPARISON:  Abdominal CT yesterday, as well as 07/19/2020 FINDINGS: Gallbladder: Partially distended. No gallstones or wall thickening visualized. No sonographic Murphy sign noted by sonographer. Common bile duct: Diameter: 4 mm. Liver: Diffusely increased in parenchymal echogenicity. There is an area of focal fatty sparing adjacent to the gallbladder fossa that corresponds to that seen on CT. No discrete focal lesion. No capsular nodularity. Portal vein is patent on color Doppler imaging with normal direction of blood flow towards the liver. Other: No right upper quadrant ascites. IMPRESSION: 1. Normal sonographic appearance of the gallbladder and biliary tree. 2. Hepatic steatosis with focal fatty sparing adjacent to the gallbladder fossa. Electronically Signed   By: 09/16/2020 M.D.   On: 07/23/2020 23:29    Labs: BMET Recent Labs  Lab 07/19/20 1653 07/22/20 1012 07/22/20 1950 07/23/20 0655 07/24/20 0438  NA 138 137 135 140 140  K 4.5 4.0 3.8 3.9 4.2  CL 101 104 103 106 104  CO2 26 22 18* 21* 24  GLUCOSE 117* 112* 95 91 98  BUN 28* 46* 41* 37* 28*  CREATININE 2.09* 4.44* 4.04* 3.54* 2.66*  CALCIUM 10.1 8.4* 8.4* 8.8* 9.2   CBC Recent Labs  Lab 07/19/20 1653 07/22/20 1012 07/23/20 0655 07/24/20 0438  WBC 16.4* 13.7* 10.0 10.0  NEUTROABS 11.8* 9.3* 5.9 5.2  HGB 14.7 14.2 14.9 13.6  HCT 44.4 41.8 42.6 41.6  MCV 89.9 88.6 87.3 89.7  PLT 356 334 351 372  Medications:    . fentaNYL (SUBLIMAZE) injection  100 mcg Intravenous Once  . heparin  5,000 Units Subcutaneous Q8H  . pantoprazole  40 mg Oral BID AC      Anthony Sar, MD [facility] 07/24/2020, 1:35 PM

## 2020-07-24 NOTE — Progress Notes (Signed)
[facility] Progress Note    Since last GI note: [place] yesterday showed normal GB, likely fatty liver.  No abdominal pains overnight.  No back pains.  He awoke feeling quite well. Ate some breakfast already without abd pains, n/v.  Did taste the food again about 15 minutes after completing his meal.  Objective: Vital signs in last 24 hours: Temp:  [98 F (36.7 C)-98.6 F (37 C)] 98.6 F (37 C) (01/11 0511) Pulse Rate:  [60-72] 66 (01/11 0511) Resp:  [18] 18 (01/11 0511) BP: (118-138)/(87-94) 118/88 (01/11 0511) SpO2:  [97 %-98 %] 97 % (01/11 0511) Weight:  [90.9 kg] 90.9 kg (01/11 0511) Last BM Date: 07/22/20 General: alert and oriented times 3 Heart: regular rate and rythm Abdomen: soft, non-tender, non-distended, normal bowel sounds  Lab Results: Recent Labs    07/22/20 1012 07/23/20 0655 07/24/20 0438  WBC 13.7* 10.0 10.0  HGB 14.2 14.9 13.6  PLT 334 351 372  MCV 88.6 87.3 89.7   Recent Labs    07/22/20 1950 07/23/20 0655 07/24/20 0438  NA 135 140 140  K 3.8 3.9 4.2  CL 103 106 104  CO2 18* 21* 24  GLUCOSE 95 91 98  BUN 41* 37* 28*  CREATININE 4.04* 3.54* 2.66*  CALCIUM 8.4* 8.8* 9.2   Recent Labs    07/22/20 1012 07/23/20 0655  PROT 7.2 7.6  ALBUMIN 3.4* 3.3*  AST 18 24  ALT 24 32  ALKPHOS 72 71  BILITOT 0.5 0.6   Recent Labs    07/22/20 1950  INR 1.0    Medications: Scheduled Meds: . fentaNYL (SUBLIMAZE) injection  100 mcg Intravenous Once  . heparin  5,000 Units Subcutaneous Q8H  . pantoprazole  40 mg Oral Daily   Continuous Infusions: . sodium chloride Stopped (07/22/20 1639)   PRN Meds:.sodium chloride, acetaminophen **OR** acetaminophen, albuterol  Assessment/Plan: 45 y.o. male with back, vague abd pains  PPI BID until seen in my office, currently he has an appointment for 2/21.  No further GI workup for now.  Please call or page with any further questions or concerns.   Rachael Fee, MD  07/24/2020, 8:22  AM [facility] Pager [phone]

## 2020-07-25 ENCOUNTER — Telehealth: Payer: Self-pay | Admitting: Gastroenterology

## 2020-07-25 NOTE — Telephone Encounter (Signed)
Patient called said he saw DR. Christella Hartigan at the hospital and the Pantoprazole medication requires prior auth

## 2020-07-25 NOTE — Telephone Encounter (Signed)
Spoke with the pt and advised him to have the pharmacy send a prior auth form to our office.  I also advised that he can try OTC pantoprazole as well. The pt has been advised of the information and verbalized understanding.

## 2020-07-27 LAB — CULTURE, BLOOD (ROUTINE X 2)
Culture: NO GROWTH
Culture: NO GROWTH
Special Requests: ADEQUATE
Special Requests: ADEQUATE

## 2020-08-02 ENCOUNTER — Encounter: Payer: Self-pay | Admitting: Gastroenterology

## 2020-09-03 ENCOUNTER — Telehealth: Payer: Self-pay | Admitting: Gastroenterology

## 2020-09-03 ENCOUNTER — Ambulatory Visit: Payer: 59 | Admitting: Gastroenterology

## 2020-09-03 MED ORDER — PANTOPRAZOLE SODIUM 40 MG PO TBEC: 40.0000 mg | DELAYED_RELEASE_TABLET | Freq: Two times a day (BID) | ORAL | 0 refills | Status: AC

## 2020-09-03 NOTE — Telephone Encounter (Signed)
Patient called to cancel his appointment due to work meeting and is requesting if we can refill the pantoprazole medication.

## 2020-09-03 NOTE — Telephone Encounter (Signed)
Refill for pantoprazole was sent to patient's pharmacy.

## 2020-09-03 NOTE — Telephone Encounter (Signed)
Please send to the [facility] thanks  

## 2020-11-05 ENCOUNTER — Ambulatory Visit: Payer: 59 | Admitting: Gastroenterology

## 2021-06-18 IMAGING — US US ABDOMEN LIMITED
1 series · 14 of 25 positions shown · non-contrast
Comparison: Abdominal CT yesterday, as well as 07/19/2020

CLINICAL DATA: Upper abdominal pain.

EXAM:
ULTRASOUND ABDOMEN LIMITED RIGHT UPPER QUADRANT

[Series 1: us abdomen limited ruq (liver/gb) · 14 of 57 slices shown]
[im 1/57]
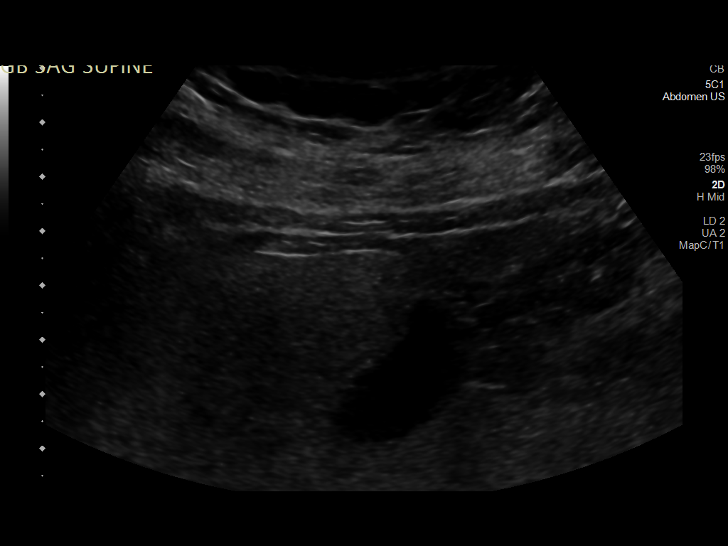
[im 5/57]
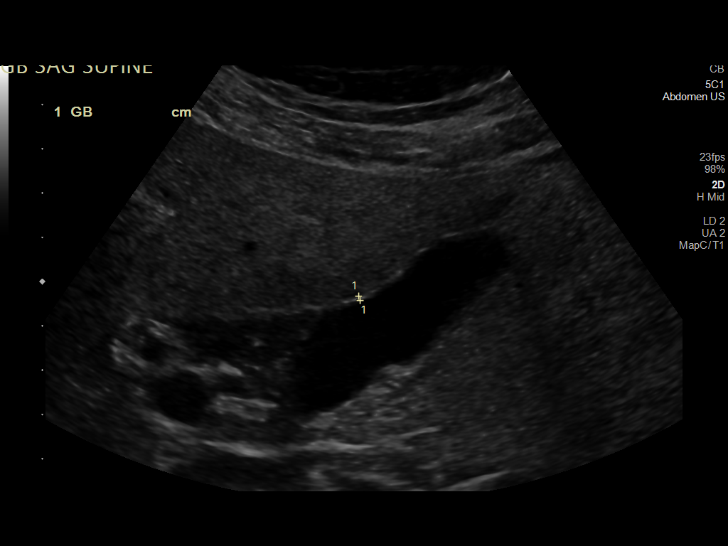
[im 10/57]
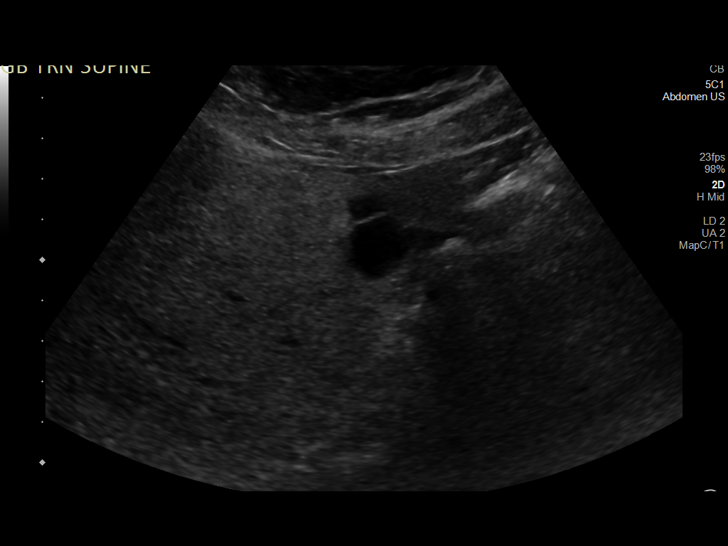
[im 15/57]
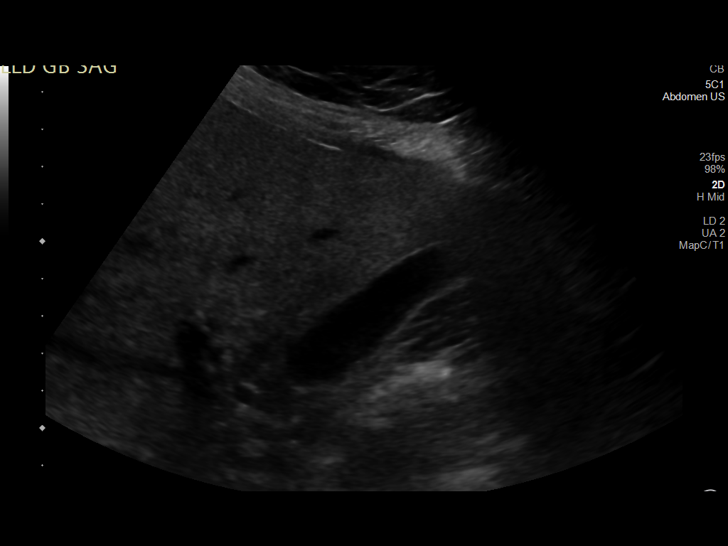
[im 19/57]
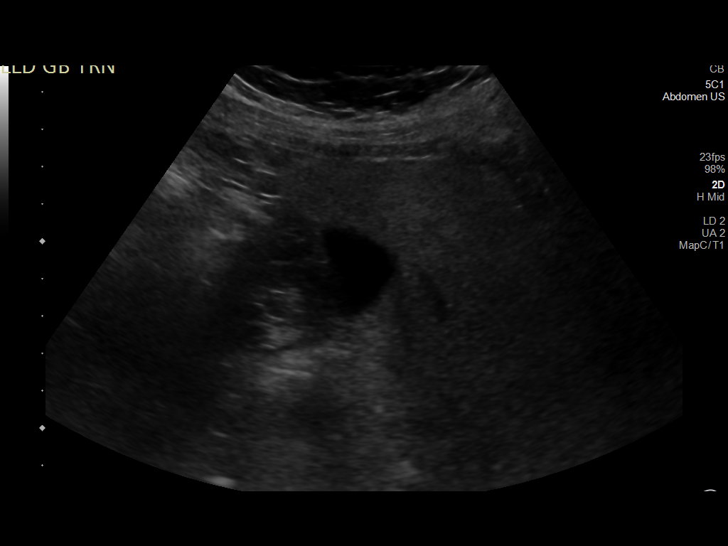
[im 22/57]
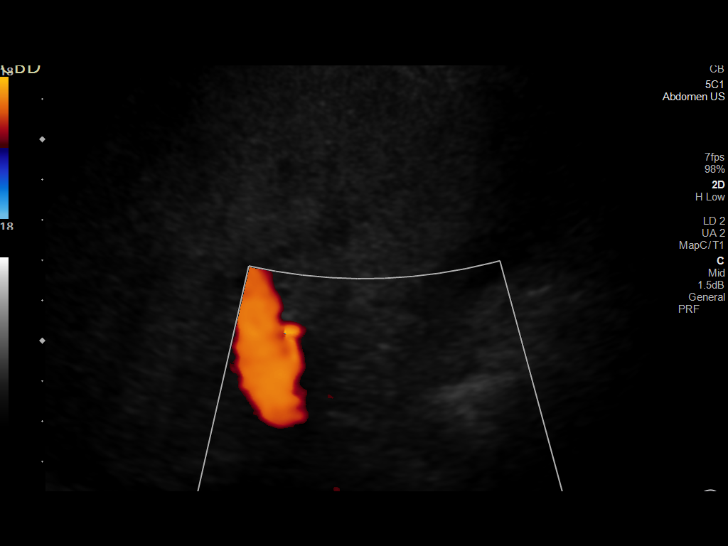
[im 26/57]
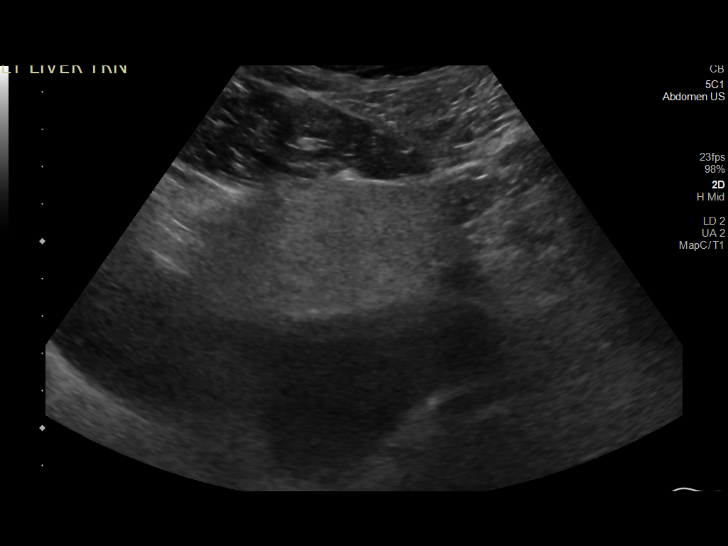
[im 31/57]
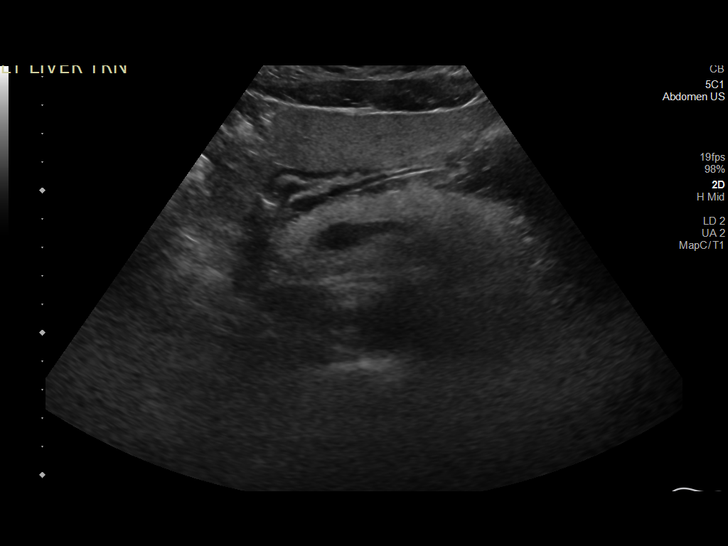
[im 36/57]
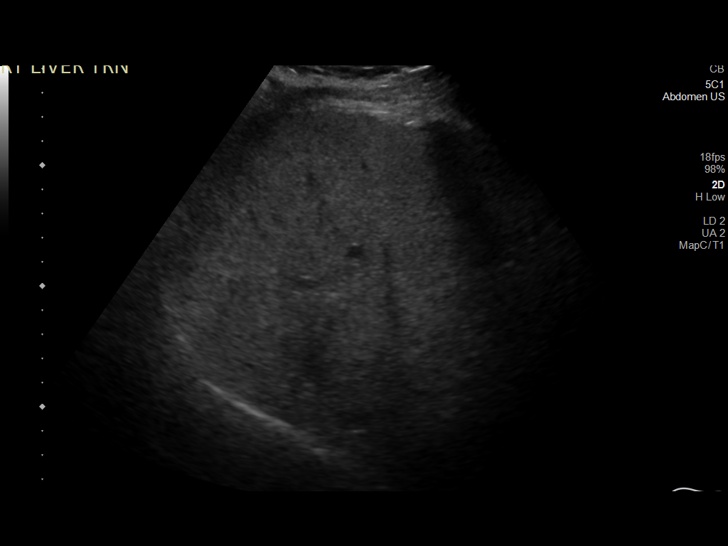
[im 38/57]
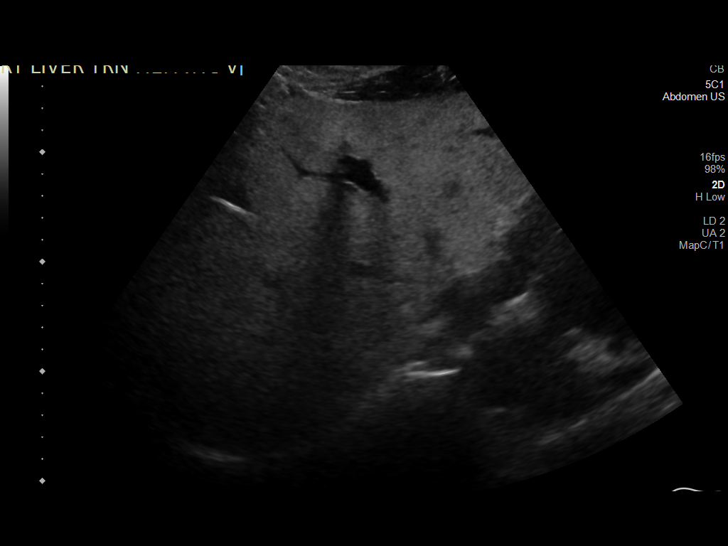
[im 43/57]
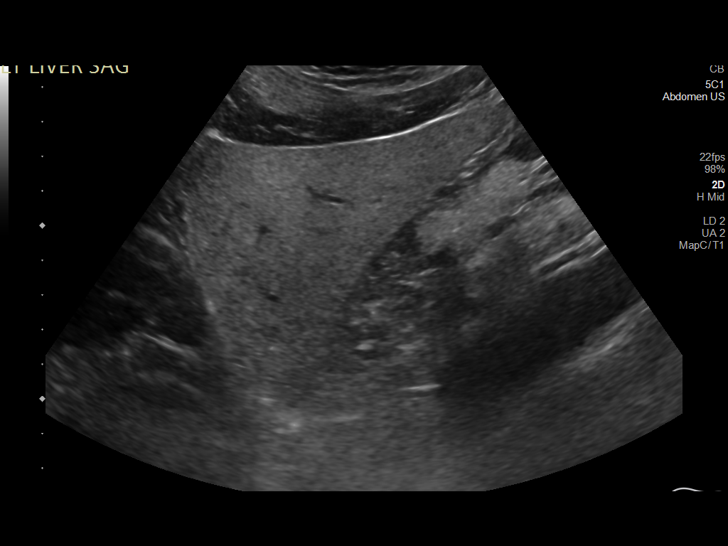
[im 47/57]
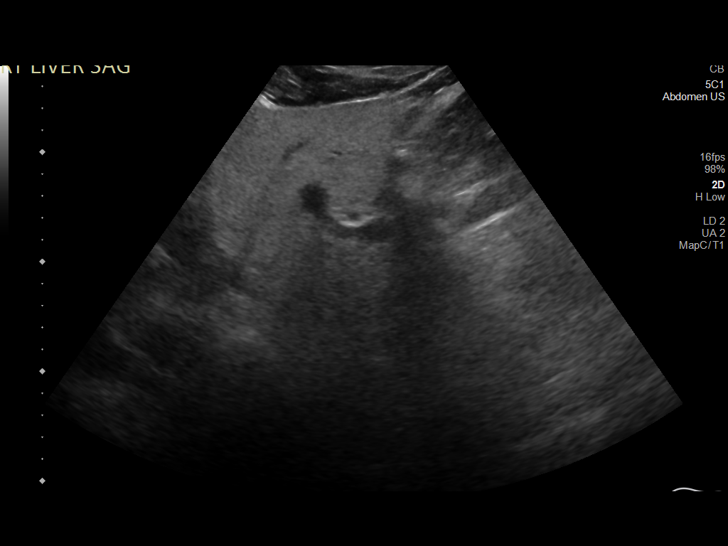
[im 52/57]
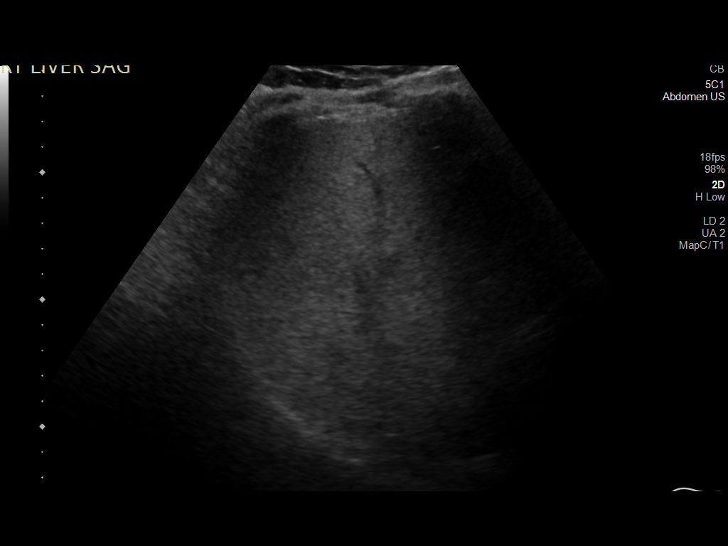
[im 57/57]
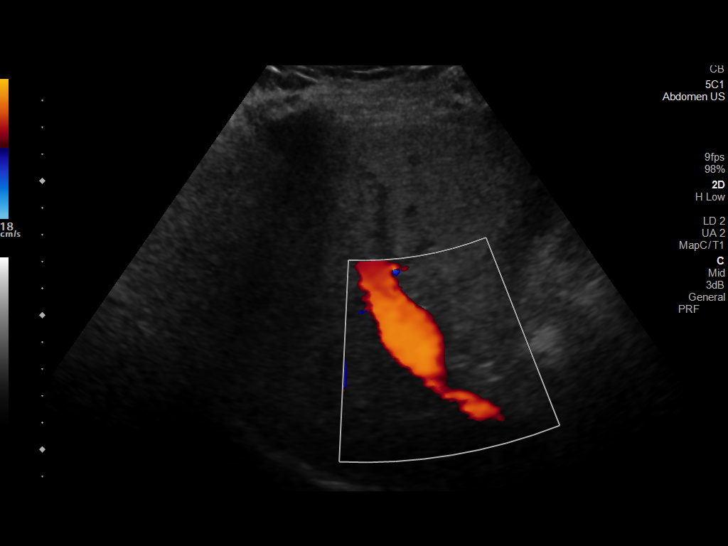

[14 of 25 positions shown; findings below may reference images not displayed]

FINDINGS: Gallbladder:

Partially distended. No gallstones or wall thickening visualized. No
sonographic Murphy sign noted by sonographer.

Common bile duct:

Diameter: 4 mm.

Liver:

Diffusely increased in parenchymal echogenicity. There is an area of
focal fatty sparing adjacent to the gallbladder fossa that
corresponds to that seen on CT. No discrete focal lesion. No
capsular nodularity. Portal vein is patent on color Doppler imaging
with normal direction of blood flow towards the liver.

Other: No right upper quadrant ascites.
IMPRESSION: 1. Normal sonographic appearance of the gallbladder and biliary
tree.
2. Hepatic steatosis with focal fatty sparing adjacent to the
gallbladder fossa.
